# Patient Record
Sex: Male | Born: 1965 | Race: Black or African American | Hispanic: No | Marital: Married | Smoking: Current some day smoker
Health system: Southern US, Community
[De-identification: ages and names within clinical notes are randomized; demographics above are authoritative.]

## PROBLEM LIST (undated history)

## (undated) DIAGNOSIS — E785 Hyperlipidemia, unspecified: Secondary | ICD-10-CM

## (undated) HISTORY — DX: Hyperlipidemia, unspecified: E78.5

## (undated) HISTORY — PX: HAND SURGERY: SHX662

## (undated) HISTORY — PX: BACK SURGERY: SHX140

---

## 2002-10-02 ENCOUNTER — Emergency Department (HOSPITAL_COMMUNITY): Admission: EM | Admit: 2002-10-02 | Discharge: 2002-10-02 | Payer: Self-pay | Admitting: Emergency Medicine

## 2003-07-30 ENCOUNTER — Emergency Department (HOSPITAL_COMMUNITY): Admission: EM | Admit: 2003-07-30 | Discharge: 2003-07-30 | Payer: Self-pay | Admitting: Emergency Medicine

## 2004-08-03 ENCOUNTER — Emergency Department (HOSPITAL_COMMUNITY): Admission: EM | Admit: 2004-08-03 | Discharge: 2004-08-03 | Payer: Self-pay | Admitting: Emergency Medicine

## 2004-08-06 ENCOUNTER — Ambulatory Visit (HOSPITAL_BASED_OUTPATIENT_CLINIC_OR_DEPARTMENT_OTHER): Admission: RE | Admit: 2004-08-06 | Discharge: 2004-08-06 | Payer: Self-pay | Admitting: Orthopedic Surgery

## 2010-03-29 ENCOUNTER — Inpatient Hospital Stay (HOSPITAL_COMMUNITY): Admission: EM | Admit: 2010-03-29 | Discharge: 2010-04-01 | Payer: Self-pay | Admitting: Emergency Medicine

## 2010-05-11 ENCOUNTER — Ambulatory Visit: Payer: Self-pay | Admitting: Psychiatry

## 2010-08-28 LAB — COMPREHENSIVE METABOLIC PANEL
ALT: 37 U/L (ref 0–53)
AST: 29 U/L (ref 0–37)
Albumin: 3.2 g/dL — ABNORMAL LOW (ref 3.5–5.2)
Alkaline Phosphatase: 53 U/L (ref 39–117)
BUN: 8 mg/dL (ref 6–23)
CO2: 26 mEq/L (ref 19–32)
Calcium: 8.8 mg/dL (ref 8.4–10.5)
Chloride: 112 mEq/L (ref 96–112)
Creatinine, Ser: 1.31 mg/dL (ref 0.4–1.5)
GFR calc Af Amer: >60 mL/min (ref >60)
GFR calc non Af Amer: 60 mL/min — ABNORMAL LOW (ref >60)
Glucose, Bld: 105 mg/dL — ABNORMAL HIGH (ref 70–99)
Potassium: 4.1 mEq/L (ref 3.5–5.1)
Sodium: 142 mEq/L (ref 135–145)
Total Bilirubin: 1.4 mg/dL — ABNORMAL HIGH (ref 0.3–1.2)
Total Protein: 5.4 g/dL — ABNORMAL LOW (ref 6.0–8.3)

## 2010-08-28 LAB — MAGNESIUM: Magnesium: 2.2 mg/dL (ref 1.5–2.5)

## 2010-08-28 LAB — CBC
HCT: 41 % (ref 39.0–52.0)
Hemoglobin: 13.7 g/dL (ref 13.0–17.0)
MCH: 27.5 pg (ref 26.0–34.0)
MCHC: 33.4 g/dL (ref 30.0–36.0)
MCV: 82.3 fL (ref 78.0–100.0)
Platelets: 196 10*3/uL (ref 150–400)
RBC: 4.98 MIL/uL (ref 4.22–5.81)
RDW: 13.5 % (ref 11.5–15.5)
WBC: 5.8 10*3/uL (ref 4.0–10.5)

## 2010-08-28 LAB — PHOSPHORUS: Phosphorus: 3.7 mg/dL (ref 2.3–4.6)

## 2010-08-28 LAB — AMMONIA: Ammonia: 56 umol/L — ABNORMAL HIGH (ref 11–35)

## 2010-08-28 LAB — CK: Total CK: 672 U/L — ABNORMAL HIGH (ref 7–232)

## 2010-08-29 LAB — CBC
HCT: 42 % (ref 39.0–52.0)
HCT: 44.8 % (ref 39.0–52.0)
Hemoglobin: 14.5 g/dL (ref 13.0–17.0)
Hemoglobin: 15.2 g/dL (ref 13.0–17.0)
MCH: 27.7 pg (ref 26.0–34.0)
MCH: 28.1 pg (ref 26.0–34.0)
MCHC: 33.9 g/dL (ref 30.0–36.0)
MCHC: 34.5 g/dL (ref 30.0–36.0)
MCV: 81.4 fL (ref 78.0–100.0)
MCV: 81.8 fL (ref 78.0–100.0)
Platelets: 194 10*3/uL (ref 150–400)
Platelets: 202 10*3/uL (ref 150–400)
RBC: 5.16 MIL/uL (ref 4.22–5.81)
RBC: 5.48 MIL/uL (ref 4.22–5.81)
RDW: 13.2 % (ref 11.5–15.5)
RDW: 13.2 % (ref 11.5–15.5)
WBC: 4.5 10*3/uL (ref 4.0–10.5)
WBC: 5.6 10*3/uL (ref 4.0–10.5)

## 2010-08-29 LAB — COMPREHENSIVE METABOLIC PANEL
ALT: 49 U/L (ref 0–53)
ALT: 51 U/L (ref 0–53)
AST: 46 U/L — ABNORMAL HIGH (ref 0–37)
AST: 48 U/L — ABNORMAL HIGH (ref 0–37)
Albumin: 3.8 g/dL (ref 3.5–5.2)
Albumin: 3.8 g/dL (ref 3.5–5.2)
Alkaline Phosphatase: 57 U/L (ref 39–117)
Alkaline Phosphatase: 58 U/L (ref 39–117)
BUN: 8 mg/dL (ref 6–23)
BUN: 8 mg/dL (ref 6–23)
CO2: 27 mEq/L (ref 19–32)
CO2: 28 mEq/L (ref 19–32)
Calcium: 9 mg/dL (ref 8.4–10.5)
Calcium: 9.2 mg/dL (ref 8.4–10.5)
Chloride: 105 mEq/L (ref 96–112)
Chloride: 107 mEq/L (ref 96–112)
Creatinine, Ser: 1.22 mg/dL (ref 0.4–1.5)
Creatinine, Ser: 1.23 mg/dL (ref 0.4–1.5)
GFR calc Af Amer: >60 mL/min (ref >60)
GFR calc Af Amer: >60 mL/min (ref >60)
GFR calc non Af Amer: >60 mL/min (ref >60)
GFR calc non Af Amer: >60 mL/min (ref >60)
Glucose, Bld: 101 mg/dL — ABNORMAL HIGH (ref 70–99)
Glucose, Bld: 111 mg/dL — ABNORMAL HIGH (ref 70–99)
Potassium: 3.1 mEq/L — ABNORMAL LOW (ref 3.5–5.1)
Potassium: 3.9 mEq/L (ref 3.5–5.1)
Sodium: 138 mEq/L (ref 135–145)
Sodium: 141 mEq/L (ref 135–145)
Total Bilirubin: 1 mg/dL (ref 0.3–1.2)
Total Bilirubin: 1.1 mg/dL (ref 0.3–1.2)
Total Protein: 6 g/dL (ref 6.0–8.3)
Total Protein: 6.2 g/dL (ref 6.0–8.3)

## 2010-08-29 LAB — URINALYSIS, ROUTINE W REFLEX MICROSCOPIC
Bilirubin Urine: NEGATIVE
Glucose, UA: NEGATIVE mg/dL
Hgb urine dipstick: NEGATIVE
Ketones, ur: NEGATIVE mg/dL
Nitrite: NEGATIVE
Protein, ur: NEGATIVE mg/dL
Specific Gravity, Urine: 1.019 (ref 1.005–1.030)
Urobilinogen, UA: 1 mg/dL (ref 0.0–1.0)
pH: 6 (ref 5.0–8.0)

## 2010-08-29 LAB — DIFFERENTIAL
Basophils Absolute: 0 10*3/uL (ref 0.0–0.1)
Basophils Absolute: 0 10*3/uL (ref 0.0–0.1)
Basophils Relative: 0 % (ref 0–1)
Basophils Relative: 0 % (ref 0–1)
Eosinophils Absolute: 0.1 10*3/uL (ref 0.0–0.7)
Eosinophils Absolute: 0.1 10*3/uL (ref 0.0–0.7)
Eosinophils Relative: 1 % (ref 0–5)
Eosinophils Relative: 1 % (ref 0–5)
Lymphocytes Relative: 37 % (ref 12–46)
Lymphocytes Relative: 40 % (ref 12–46)
Lymphs Abs: 1.7 10*3/uL (ref 0.7–4.0)
Lymphs Abs: 2.3 10*3/uL (ref 0.7–4.0)
Monocytes Absolute: 0.3 10*3/uL (ref 0.1–1.0)
Monocytes Absolute: 0.3 10*3/uL (ref 0.1–1.0)
Monocytes Relative: 6 % (ref 3–12)
Monocytes Relative: 6 % (ref 3–12)
Neutro Abs: 2.5 10*3/uL (ref 1.7–7.7)
Neutro Abs: 2.9 10*3/uL (ref 1.7–7.7)
Neutrophils Relative %: 52 % (ref 43–77)
Neutrophils Relative %: 55 % (ref 43–77)

## 2010-08-29 LAB — SALICYLATE LEVEL: Salicylate Lvl: <4 mg/dL (ref 2.8–20.0)

## 2010-08-29 LAB — ACETAMINOPHEN LEVEL: Acetaminophen (Tylenol), Serum: <10 ug/mL — ABNORMAL LOW (ref 10–30)

## 2010-08-29 LAB — RAPID URINE DRUG SCREEN, HOSP PERFORMED
Amphetamines: NOT DETECTED
Amphetamines: NOT DETECTED
Barbiturates: NOT DETECTED
Benzodiazepines: POSITIVE — AB
Benzodiazepines: POSITIVE — AB
Cocaine: NOT DETECTED
Cocaine: NOT DETECTED
Opiates: NOT DETECTED
Opiates: NOT DETECTED
Tetrahydrocannabinol: NOT DETECTED
Tetrahydrocannabinol: NOT DETECTED

## 2010-08-29 LAB — ETHANOL: Alcohol, Ethyl (B): <5 mg/dL (ref 0–10)

## 2010-08-29 LAB — MRSA PCR SCREENING: MRSA by PCR: NEGATIVE

## 2010-08-29 LAB — MAGNESIUM: Magnesium: 2.2 mg/dL (ref 1.5–2.5)

## 2010-08-29 LAB — CK TOTAL AND CKMB (NOT AT ARMC)
CK, MB: 14.4 ng/mL (ref 0.3–4.0)
Relative Index: 0.9 (ref 0.0–2.5)
Total CK: 1617 U/L — ABNORMAL HIGH (ref 7–232)

## 2010-08-29 LAB — CARDIAC PANEL(CRET KIN+CKTOT+MB+TROPI)
CK, MB: 8 ng/mL (ref 0.3–4.0)
Relative Index: 0.8 (ref 0.0–2.5)
Total CK: 986 U/L — ABNORMAL HIGH (ref 7–232)
Troponin I: 0.02 ng/mL (ref 0.00–0.06)

## 2010-08-29 LAB — AMMONIA: Ammonia: 40 umol/L — ABNORMAL HIGH (ref 11–35)

## 2010-11-01 NOTE — Op Note (Signed)
NAMEQUANTAVIS, OBRYANT NO.:  [id number]   MEDICAL RECORD NO.:  [id number]          PATIENT TYPE:  AMB   LOCATION:  DSC                          FACILITY:  [facility]   PHYSICIAN:  Dionne Ano. Gramig III, M.D.DATE OF BIRTH:  Jan 26, 1966   DATE OF PROCEDURE:  08/06/2004  DATE OF DISCHARGE:                                 OPERATIVE REPORT   PREOPERATIVE DIAGNOSES:  1.  Right hand fifth metacarpal fracture with gross displacement.  2.  Open type 1 laceration over the dorsal aspect of the hand.   POSTOPERATIVE DIAGNOSIS:  1.  Right hand fifth metacarpal fracture with gross displacement.  2.  Open type 1 laceration over the dorsal aspect of the hand.   PROCEDURES:  1.  Irrigation and debridement of type 1 open fifth metacarpal fracture of      the hand.  2.  Open reduction and internal fixation of right hand fifth metacarpal      fracture.  3.  Stress radiography of right hand.   SURGEON:  Dionne Ano. Amanda Pea, M.D.   ASSISTANT:  Karie Chimera, P.A.-C.   COMPLICATIONS:  None.   ANESTHESIA:  General.   COMPLICATIONS:  None.   TOURNIQUET TIME:  Less than an hour.   ESTIMATED BLOOD LOSS:  Minimal.   INDICATIONS FOR THE PROCEDURE:  This patient is a pleasant 45 year old male  who presents with the above-mentioned diagnoses.  I have counseled him in  regards to the risks and benefits of surgery, including the risks of  infection, bleeding, anesthesia, damage to surrounding structures and  failure of surgery to accomplish the intended goals is relieving symptoms  and restoring function.  Fortunately there are no signs of infection,  however, I feel this is going to require an I&D, as well as the ORIF.  He  understands this.  He presents to my office yesterday.  I booked him for  surgery as soon as possible, of course.  He understands the risks and  benefits of injection, bleeding, anesthesia, damage to normal structures and  failure of surgery to accomplish the  intended goals is of relieving symptoms  and restoring function.   OPERATION IN DETAIL:  The patient was seen by myself and anesthesia, taken  to the operative suite and underwent a smooth induction of general  anesthesia.  He was laid supine, fully padded, prepped and draped in the  usual sterile fashion with Betadine scrub and paint.  A sterile field was  secured.  Once this was done, I identified the small puncture wound over the  dorsal aspect of his hand.  Preoperatively, the patient stated that  __________  about a 2 mm rim.  Following this, I dissected down to the  fracture site, delivered both bony ends, curetted and debrided the area and  saw no signs of infection.  I then performed I&D with greater than 3 L of  saline about the fracture site.  Following this, I secured a new sterile  field and the I&D was complete.  The patient then had a small incision made  about the base of  the fifth metacarpal.  A pilot hole was made with drill.  Following this, a blunt ended 0.062 K-wire was introduced and threaded up  the medullary shaft, across the fracture site, which was held reduced, and  then engaging the distal bone.  Once this was done, I then prebent the wire  __________  fracture and secured the pin away from tendinous structures and  the dorsal sensory branch of the ulnar nerve.  This crossed the fracture  site nicely.  It had excellent stability.  I checked this under stress  radiography, performed a stress x-ray and all looked quite well.  I was  pleased with this and the findings.  Once this was done, I then performed  bipolar electrocautery.  The wound was closed loosely distally and  proximally was closed with Prolene to my satisfaction.  Xeroform gauze and a  volar splint were applied without difficulty.  He tolerated the procedure  well.  The sponge, needle and instrument counts were reported as correct.  He will be monitored in the recovery room, given additional  antibiotics,  discharged home on appropriate medicine and antibiotics and return to see [place]  in the office in five to seven days.  All questions have been encouraged and  answered.      WMG/MEDQ  D:  08/06/2004  T:  08/06/2004  Job:  [id number]

## 2011-06-19 ENCOUNTER — Emergency Department (HOSPITAL_BASED_OUTPATIENT_CLINIC_OR_DEPARTMENT_OTHER)
Admission: EM | Admit: 2011-06-19 | Discharge: 2011-06-19 | Disposition: A | Discharge status: Home / Self Care | Payer: Self-pay | Attending: Emergency Medicine | Admitting: Emergency Medicine

## 2011-06-19 ENCOUNTER — Emergency Department (INDEPENDENT_AMBULATORY_CARE_PROVIDER_SITE_OTHER): Payer: Self-pay

## 2011-06-19 ENCOUNTER — Encounter: Payer: Self-pay | Admitting: *Deleted

## 2011-06-19 DIAGNOSIS — R0989 Other specified symptoms and signs involving the circulatory and respiratory systems: Secondary | ICD-10-CM

## 2011-06-19 DIAGNOSIS — J9819 Other pulmonary collapse: Secondary | ICD-10-CM

## 2011-06-19 DIAGNOSIS — R05 Cough: Secondary | ICD-10-CM | POA: Insufficient documentation

## 2011-06-19 DIAGNOSIS — R059 Cough, unspecified: Secondary | ICD-10-CM | POA: Insufficient documentation

## 2011-06-19 MED ORDER — BENZONATATE 100 MG PO CAPS: 100.0000 mg | ORAL_CAPSULE | Freq: Three times a day (TID) | ORAL | Status: AC | PRN

## 2011-06-19 MED ORDER — ALBUTEROL SULFATE HFA 108 (90 BASE) MCG/ACT IN AERS
2.0000 | INHALATION_SPRAY | RESPIRATORY_TRACT | Status: DC | PRN
  Administered 2011-06-19: 2 via RESPIRATORY_TRACT
  Filled 2011-06-19: qty 6.7

## 2011-06-19 NOTE — ED Provider Notes (Signed)
History     CSN: [id number]  Arrival date & time 06/19/11  6295   First MD Initiated Contact with Patient 06/19/11 1946      Chief Complaint  Patient presents with  . Cough    (Consider location/radiation/quality/duration/timing/severity/associated sxs/prior treatment) HPI  45yoM is a healthy presents with cough and nasal congestion x3 days. He states he is coughing worse at night. Denies shortness of breath. He states his phlegm is yellow with streaks of blood. He has noticed less than 1 teaspoon. He denies fever, chills. He denies chest pain except for when coughing. No sick contacts at home. No history of similar. He denies anticoagulants. Denies h/o VTE in self or family. No recent hosp/surg/immob. No h/o cancer. Denies exogenous hormone use, no leg pain or swelling    ED Notes, ED Provider Notes from 06/19/11 0000 to 06/19/11 19:06:57       Amy Theotis Barrio, RN 06/19/2011 19:06      Pt amb to triage with quick steady gait reporting cough and congestion x Monday. Pt states his sputum is sometimes bloody, and mostly yellow and thick.     History reviewed. No pertinent past medical history.  History reviewed. No pertinent past surgical history.  History reviewed. No pertinent family history.  History  Substance Use Topics  . Smoking status: Not on file  . Smokeless tobacco: Not on file  . Alcohol Use: Not on file      Review of Systems  All other systems reviewed and are negative.   except as noted HPI   Allergies  Review of patient's allergies indicates no known allergies.  Home Medications   Current Outpatient Rx  Name Route Sig Dispense Refill  . BENZONATATE 100 MG PO CAPS Oral Take 1 capsule (100 mg total) by mouth 3 (three) times daily as needed for cough. 20 capsule 0    BP 143/74  Pulse 80  Temp(Src) 98.9 F (37.2 C) (Oral)  Resp 20  Ht 6\' 3"  (1.905 m)  Wt 229 lb 11.2 oz (104.191 kg)  BMI 28.71 kg/m2  SpO2 99%  Physical Exam  Nursing note and  vitals reviewed. Constitutional: He is oriented to person, place, and time. He appears well-developed and well-nourished. No distress.  HENT:  Head: Atraumatic.  Mouth/Throat: Oropharynx is clear and moist.       Tm clear b/l Posterior op with mild erythema  Eyes: Conjunctivae are normal. Pupils are equal, round, and reactive to light.  Neck: Neck supple.  Cardiovascular: Normal rate, regular rhythm, normal heart sounds and intact distal pulses.  Exam reveals no gallop and no friction rub.   No murmur heard. Pulmonary/Chest: Effort normal. No respiratory distress. He has no wheezes. He has no rales.  Abdominal: Soft. Bowel sounds are normal. There is no tenderness. There is no rebound and no guarding.  Musculoskeletal: Normal range of motion. He exhibits no edema and no tenderness.  Neurological: He is alert and oriented to person, place, and time.  Skin: Skin is warm and dry.  Psychiatric: He has a normal mood and affect.    ED Course  Procedures (including critical care time)  Labs Reviewed - No data to display Dg Chest 2 View  06/19/2011  *RADIOLOGY REPORT*  Clinical Data: Congestion.  CHEST - 2 VIEW  Comparison: 03/31/2010  Findings: Mild bibasilar atelectasis.  No focal infiltrate, edema or pleural effusion.  Heart size is normal.  The bony thorax is unremarkable.  IMPRESSION: Mild bibasilar atelectasis.  Original Report  Authenticated By: Reola Calkins, M.D.     1. Cough     MDM  Cough likely secondary to upper respiratory infection. I believe his blood streaked sputum to be related to irritation of his upper airway. Prescribed albuterol inhaler when necessary for cough as well as Tessalon Perles. The patient is to follow with his primary care Dr. He has been given strict precautions for return including gross hemoptysis, chest pain, shortness of breath.        Forbes Cellar, MD 06/19/11 2107

## 2011-06-19 NOTE — ED Notes (Signed)
Pt amb to triage with quick steady gait reporting cough and congestion x Monday. Pt states his sputum is sometimes bloody, and mostly yellow and thick.

## 2011-06-27 ENCOUNTER — Emergency Department (HOSPITAL_BASED_OUTPATIENT_CLINIC_OR_DEPARTMENT_OTHER)
Admission: EM | Admit: 2011-06-27 | Discharge: 2011-06-27 | Disposition: A | Discharge status: Home / Self Care | Payer: Self-pay | Attending: Emergency Medicine | Admitting: Emergency Medicine

## 2011-06-27 ENCOUNTER — Encounter (HOSPITAL_BASED_OUTPATIENT_CLINIC_OR_DEPARTMENT_OTHER): Payer: Self-pay

## 2011-06-27 ENCOUNTER — Emergency Department (INDEPENDENT_AMBULATORY_CARE_PROVIDER_SITE_OTHER): Payer: Self-pay

## 2011-06-27 DIAGNOSIS — R059 Cough, unspecified: Secondary | ICD-10-CM | POA: Insufficient documentation

## 2011-06-27 DIAGNOSIS — J069 Acute upper respiratory infection, unspecified: Secondary | ICD-10-CM | POA: Insufficient documentation

## 2011-06-27 DIAGNOSIS — R0989 Other specified symptoms and signs involving the circulatory and respiratory systems: Secondary | ICD-10-CM

## 2011-06-27 DIAGNOSIS — R079 Chest pain, unspecified: Secondary | ICD-10-CM | POA: Insufficient documentation

## 2011-06-27 DIAGNOSIS — R05 Cough: Secondary | ICD-10-CM

## 2011-06-27 LAB — CBC
MCHC: 34.6 g/dL (ref 30.0–36.0)
RDW: 13.4 % (ref 11.5–15.5)

## 2011-06-27 MED ORDER — ALBUTEROL SULFATE HFA 108 (90 BASE) MCG/ACT IN AERS: 2.0000 | INHALATION_SPRAY | RESPIRATORY_TRACT | Status: DC | PRN

## 2011-06-27 MED ORDER — HYDROCODONE-ACETAMINOPHEN 7.5-325 MG/15ML PO SOLN: 15.0000 mL | Freq: Four times a day (QID) | ORAL | Status: AC | PRN

## 2011-06-27 NOTE — ED Notes (Signed)
C/o cont'd prod cough-was seen here for same last week

## 2011-06-27 NOTE — ED Notes (Signed)
Informed by xray tech that pt stating he needs to leave by 1230p to pick up child

## 2011-06-27 NOTE — ED Provider Notes (Addendum)
History     CSN: [id number]  Arrival date & time 06/27/11  1047   First MD Initiated Contact with Patient 06/27/11 1111      Chief Complaint  Patient presents with  . Cough    (Consider location/radiation/quality/duration/timing/severity/associated sxs/prior treatment) Patient is a 46 y.o. male presenting with cough. The history is provided by the patient.  Cough Associated symptoms include chest pain. Pertinent negatives include no headaches and no shortness of breath.   patient was seen in ER on New Year's Eve for cough. He hasn't some amounts that time. He was given an inhaler and Tessalon Perles at bedtime. He states he has not gotten better. He still continues to cough. No fevers. He states he still has yellow sputum. His hemoptysis is cleared up. No swelling of his legs. Mild chest pain with it. Some fatigue when it flares up. No nausea vomiting diarrhea. No sore throat. No relief with his medications.   History reviewed. No pertinent past medical history.  Past Surgical History  Procedure Date  . Hand surgery   . Back surgery     No family history on file.  History  Substance Use Topics  . Smoking status: Never Smoker   . Smokeless tobacco: Not on file  . Alcohol Use: No      Review of Systems  Constitutional: Positive for fatigue. Negative for activity change and appetite change.  HENT: Negative for neck stiffness.   Eyes: Negative for pain.  Respiratory: Positive for cough. Negative for chest tightness and shortness of breath.   Cardiovascular: Positive for chest pain. Negative for leg swelling.  Gastrointestinal: Negative for nausea, vomiting, abdominal pain and diarrhea.  Genitourinary: Negative for flank pain.  Musculoskeletal: Negative for back pain.  Skin: Negative for rash.  Neurological: Negative for weakness, numbness and headaches.  Psychiatric/Behavioral: Negative for behavioral problems.    Allergies  Review of patient's allergies indicates no  known allergies.  Home Medications   Current Outpatient Rx  Name Route Sig Dispense Refill  . VENTOLIN IN Inhalation Inhale into the lungs.    . ALBUTEROL SULFATE HFA 108 (90 BASE) MCG/ACT IN AERS Inhalation Inhale 2 puffs into the lungs every 4 (four) hours as needed for wheezing. 6.7 g 0  . BENZONATATE 100 MG PO CAPS Oral Take 1 capsule (100 mg total) by mouth 3 (three) times daily as needed for cough. 20 capsule 0  . HYDROCODONE-ACETAMINOPHEN 7.5-325 MG/15ML PO SOLN Oral Take 15 mLs by mouth 4 (four) times daily as needed (cough). 120 mL 0    BP 129/67  Pulse 77  Temp(Src) 97.9 F (36.6 C) (Oral)  Resp 19  Ht 6\' 3"  (1.905 m)  Wt 218 lb (98.884 kg)  BMI 27.25 kg/m2  SpO2 98%  Physical Exam  Nursing note and vitals reviewed. Constitutional: He is oriented to person, place, and time. He appears well-developed and well-nourished.  HENT:  Head: Normocephalic and atraumatic.  Eyes: Pupils are equal, round, and reactive to light.  Neck: Normal range of motion. Neck supple.  Cardiovascular: Normal rate, regular rhythm and normal heart sounds.   No murmur heard. Pulmonary/Chest: Effort normal.       Mild soft wheezes right upper lung fields.  Abdominal: Soft. Bowel sounds are normal. He exhibits no distension and no mass. There is no tenderness. There is no rebound and no guarding.  Musculoskeletal: Normal range of motion. He exhibits no edema.  Neurological: He is alert and oriented to person, place, and time. No  cranial nerve deficit.  Skin: Skin is warm and dry.  Psychiatric: He has a normal mood and affect.    ED Course  Procedures (including critical care time)  Labs Reviewed  CBC - Abnormal; Notable for the following:    MCV 77.8 (*)    All other components within normal limits   Dg Chest 2 View  06/27/2011  *RADIOLOGY REPORT*  Clinical Data: Productive cough, congestion.  CHEST - 2 VIEW  Comparison: 06/19/2011  Findings: Heart and mediastinal contours are within  normal limits. No focal opacities or effusions.  No acute bony abnormality.  IMPRESSION: No active cardiopulmonary disease.  Original Report Authenticated By: Cyndie Chime, M.D.     1. URI (upper respiratory infection)       MDM  Cough. Recently seen for same. X-ray was repeated to show pneumonia now. He'll be discharged home. Platelets were done due to previous hemoptysis. They're not low.        Juliet Rude. Rubin Payor, MD 06/27/11 1516  Juliet Rude. Rubin Payor, MD 06/27/11 [phone]

## 2014-05-05 ENCOUNTER — Encounter (HOSPITAL_BASED_OUTPATIENT_CLINIC_OR_DEPARTMENT_OTHER): Payer: Self-pay | Admitting: *Deleted

## 2014-05-05 ENCOUNTER — Emergency Department (HOSPITAL_BASED_OUTPATIENT_CLINIC_OR_DEPARTMENT_OTHER)
Admission: EM | Admit: 2014-05-05 | Discharge: 2014-05-05 | Disposition: A | Discharge status: Home / Self Care | Payer: Self-pay | Attending: Emergency Medicine | Admitting: Emergency Medicine

## 2014-05-05 DIAGNOSIS — Z79899 Other long term (current) drug therapy: Secondary | ICD-10-CM | POA: Insufficient documentation

## 2014-05-05 DIAGNOSIS — J111 Influenza due to unidentified influenza virus with other respiratory manifestations: Secondary | ICD-10-CM

## 2014-05-05 NOTE — ED Provider Notes (Signed)
CSN: [id number]     Arrival date & time 05/05/14  0901 History   First MD Initiated Contact with Patient 05/05/14 [phone]     Chief Complaint  Patient presents with  . Influenza     (Consider location/radiation/quality/duration/timing/severity/associated sxs/prior Treatment) HPI Comments: Patient presents with flulike symptoms. He states that on Monday which was 5 days ago he started having symptoms of myalgias associated with runny nose congestion coughing. He denies any nausea or vomiting. He denies any shortness of breath. He has not been using medications or the symptoms. He complains of fatigue and subjective fevers. His wife was diagnosed with influenza via a rapid flu test a week ago. She was started on Tamiflu.  Patient is a 48 y.o. male presenting with flu symptoms.  Influenza Presenting symptoms: cough, fatigue, fever, myalgias and rhinorrhea   Presenting symptoms: no diarrhea, no headaches, no nausea, no shortness of breath and no vomiting   Associated symptoms: nasal congestion   Associated symptoms: no chills     History reviewed. No pertinent past medical history. Past Surgical History  Procedure Laterality Date  . Hand surgery    . Back surgery    . Back surgery     No family history on file. History  Substance Use Topics  . Smoking status: Never Smoker   . Smokeless tobacco: Not on file  . Alcohol Use: No    Review of Systems  Constitutional: Positive for fever and fatigue. Negative for chills and diaphoresis.  HENT: Positive for congestion and rhinorrhea. Negative for sneezing.   Eyes: Negative.   Respiratory: Positive for cough. Negative for chest tightness and shortness of breath.   Cardiovascular: Negative for chest pain and leg swelling.  Gastrointestinal: Negative for nausea, vomiting, abdominal pain, diarrhea and blood in stool.  Genitourinary: Negative for frequency, hematuria, flank pain and difficulty urinating.  Musculoskeletal: Positive for  myalgias. Negative for back pain and arthralgias.  Skin: Negative for rash.  Neurological: Negative for dizziness, speech difficulty, weakness, numbness and headaches.      Allergies  Review of patient's allergies indicates no known allergies.  Home Medications   Prior to Admission medications   Medication Sig Start Date End Date Taking? Authorizing Provider  albuterol (PROVENTIL HFA;VENTOLIN HFA) 108 (90 BASE) MCG/ACT inhaler Inhale 2 puffs into the lungs every 4 (four) hours as needed for wheezing. 06/27/11 06/26/12  Juliet RudeNathan R. Pickering, MD  Albuterol (VENTOLIN IN) Inhale into the lungs.    Historical Provider, MD   BP 158/76 mmHg  Pulse 74  Temp(Src) 98.1 F (36.7 C) (Oral)  Resp 18  Ht 6\' 3"  (1.905 m)  Wt 218 lb (98.884 kg)  BMI 27.25 kg/m2  SpO2 100% Physical Exam  Constitutional: He is oriented to person, place, and time. He appears well-developed and well-nourished.  HENT:  Head: Normocephalic and atraumatic.  Eyes: Pupils are equal, round, and reactive to light.  Neck: Normal range of motion. Neck supple.  Cardiovascular: Normal rate, regular rhythm and normal heart sounds.   Pulmonary/Chest: Effort normal and breath sounds normal. No respiratory distress. He has no wheezes. He has no rales. He exhibits no tenderness.  Abdominal: Soft. Bowel sounds are normal. There is no tenderness. There is no rebound and no guarding.  Musculoskeletal: Normal range of motion. He exhibits no edema.  Lymphadenopathy:    He has no cervical adenopathy.  Neurological: He is alert and oriented to person, place, and time.  Skin: Skin is warm and dry. No rash noted.  Psychiatric: He has a normal mood and affect.    ED Course  Procedures (including critical care time) Labs Review Labs Reviewed - No data to display  Imaging Review No results found.   EKG Interpretation None      MDM   Final diagnoses:  Influenza    Patient with flulike symptoms with a positive recent  exposure to influenza. He is well-appearing with no shortness of breath or evidence of pneumonia on clinical exam. He's had no vomiting. He was discharged home in good condition. He was advised an over-the-counter symptomatic care. He's out of the window for appropriate treatment with Tamiflu. He was given return precautions.    Rolan BuccoMelanie Carlisle Enke, MD 05/05/14 [phone]

## 2014-05-05 NOTE — ED Notes (Signed)
Pt c/o fever, nausea, and body aches x 2 days. Pt's wife had flu-like symptoms last week. Pt did not get a flu shot.

## 2014-05-05 NOTE — Discharge Instructions (Signed)

## 2015-08-27 ENCOUNTER — Emergency Department (HOSPITAL_BASED_OUTPATIENT_CLINIC_OR_DEPARTMENT_OTHER)
Admission: EM | Admit: 2015-08-27 | Discharge: 2015-08-28 | Disposition: A | Discharge status: Home / Self Care | Payer: BLUE CROSS/BLUE SHIELD | Attending: Emergency Medicine | Admitting: Emergency Medicine

## 2015-08-27 ENCOUNTER — Encounter (HOSPITAL_BASED_OUTPATIENT_CLINIC_OR_DEPARTMENT_OTHER): Payer: Self-pay | Admitting: *Deleted

## 2015-08-27 ENCOUNTER — Emergency Department (HOSPITAL_BASED_OUTPATIENT_CLINIC_OR_DEPARTMENT_OTHER): Payer: BLUE CROSS/BLUE SHIELD

## 2015-08-27 DIAGNOSIS — R509 Fever, unspecified: Secondary | ICD-10-CM | POA: Diagnosis present

## 2015-08-27 DIAGNOSIS — J111 Influenza due to unidentified influenza virus with other respiratory manifestations: Secondary | ICD-10-CM | POA: Diagnosis not present

## 2015-08-27 DIAGNOSIS — R69 Illness, unspecified: Secondary | ICD-10-CM

## 2015-08-27 NOTE — ED Notes (Signed)
Generalized body aches, cough and subjective fever for a week.

## 2015-08-27 NOTE — ED Notes (Signed)
Generalized body aches and fever x 5 days.

## 2015-08-27 NOTE — ED Notes (Signed)
Pt reports productive cough with yellow sputum x 7 days.

## 2015-08-28 MED ORDER — PREDNISONE 50 MG PO TABS: 50.0000 mg | ORAL_TABLET | Freq: Every day | ORAL | Status: DC

## 2015-08-28 MED ORDER — PROMETHAZINE-DM 6.25-15 MG/5ML PO SYRP: 5.0000 mL | ORAL_SOLUTION | Freq: Four times a day (QID) | ORAL | Status: DC | PRN

## 2015-08-28 MED ORDER — GUAIFENESIN ER 1200 MG PO TB12: 1.0000 | ORAL_TABLET | Freq: Two times a day (BID) | ORAL | Status: DC

## 2015-08-28 NOTE — ED Notes (Signed)
Pt verbalizes understanding of d/c instructions and denies any further needs at this time. 

## 2015-08-28 NOTE — Discharge Instructions (Signed)
Return here as needed. Follow up with your doctor. INcrease your fluid intake and rest as much as possible.

## 2015-08-28 NOTE — ED Provider Notes (Signed)
CSN: [id number]     Arrival date & time 08/27/15  2004 History   First MD Initiated Contact with Patient 08/27/15 2303     Chief Complaint  Patient presents with  . Fever     (Consider location/radiation/quality/duration/timing/severity/associated sxs/prior Treatment) HPI Patient presents to the emergency department with cough, body aches, fever over the last 7 days.  The patient states that he took some over-the-counter cough and cold medications without significant relief of his symptoms.  Patient denies chest pain, shortness of breath, nausea, vomiting, headache, blurred vision, weakness, dizziness, back pain, neck pain, urinary incontinence, rash, near syncope or syncope.  The patient states that nothing seems make his condition, better or worse History reviewed. No pertinent past medical history. Past Surgical History  Procedure Laterality Date  . Hand surgery    . Back surgery    . Back surgery     History reviewed. No pertinent family history. Social History  Substance Use Topics  . Smoking status: Never Smoker   . Smokeless tobacco: None  . Alcohol Use: No    Review of Systems  All other systems negative except as documented in the HPI. All pertinent positives and negatives as reviewed in the HPI.   Allergies  Review of patient's allergies indicates no known allergies.  Home Medications   Prior to Admission medications   Not on File   BP 133/70 mmHg  Pulse 60  Temp(Src) 98.4 F (36.9 C) (Oral)  Resp 16  Ht 6\' 3"  (1.905 m)  Wt 98.884 kg  BMI 27.25 kg/m2  SpO2 100% Physical Exam  Constitutional: He is oriented to person, place, and time. He appears well-developed and well-nourished. No distress.  HENT:  Head: Normocephalic and atraumatic.  Mouth/Throat: Oropharynx is clear and moist.  Eyes: Pupils are equal, round, and reactive to light.  Neck: Normal range of motion. Neck supple.  Cardiovascular: Normal rate, regular rhythm and normal heart sounds.  Exam  reveals no gallop and no friction rub.   No murmur heard. Pulmonary/Chest: Effort normal and breath sounds normal. No respiratory distress. He has no wheezes.  Neurological: He is alert and oriented to person, place, and time. He exhibits normal muscle tone. Coordination normal.  Skin: Skin is warm and dry. No rash noted. No erythema.  Psychiatric: He has a normal mood and affect. His behavior is normal.  Nursing note and vitals reviewed.   ED Course  Procedures (including critical care time) Labs Review Labs Reviewed - No data to display  Imaging Review Dg Chest 2 View  08/27/2015  CLINICAL DATA:  Cough, fever and body aches 1 week. EXAM: CHEST  2 VIEW COMPARISON:  06/27/2011 FINDINGS: Lungs are adequately inflated without focal consolidation or effusion. Mild stable cardiomegaly. Minimal degenerative change of the spine. IMPRESSION: No active cardiopulmonary disease. Mild stable cardiomegaly. Electronically Signed   By: Elberta Fortisaniel  Boyle M.D.   On: 08/27/2015 20:50   I have personally reviewed and evaluated these images and lab results as part of my medical decision-making.   The patient be treated for influenza-like illness.  Told to return here as needed.  Patient agrees the plan and all questions were answered.  Chest x-ray was reviewed and there is no significant abnormality noted  Charlestine Nighthristopher Wave Calzada, PA-C 08/28/15 [phone]  Rolan BuccoMelanie Belfi, MD 08/28/15 1745

## 2017-10-05 LAB — HM COLONOSCOPY

## 2018-01-06 DIAGNOSIS — E7800 Pure hypercholesterolemia, unspecified: Secondary | ICD-10-CM | POA: Insufficient documentation

## 2018-06-21 ENCOUNTER — Encounter: Payer: Self-pay | Admitting: Nurse Practitioner

## 2018-06-21 ENCOUNTER — Ambulatory Visit (INDEPENDENT_AMBULATORY_CARE_PROVIDER_SITE_OTHER): Payer: 59 | Admitting: Nurse Practitioner

## 2018-06-21 ENCOUNTER — Other Ambulatory Visit (HOSPITAL_COMMUNITY)
Admission: RE | Admit: 2018-06-21 | Discharge: 2018-06-21 | Disposition: A | Discharge status: Home / Self Care | Payer: 59 | Source: Ambulatory Visit | Attending: Nurse Practitioner | Admitting: Nurse Practitioner

## 2018-06-21 VITALS — BP 120/76 | HR 74 | Temp 98.4°F | Ht 72.84 in | Wt 236.4 lb

## 2018-06-21 DIAGNOSIS — E782 Mixed hyperlipidemia: Secondary | ICD-10-CM | POA: Diagnosis not present

## 2018-06-21 DIAGNOSIS — Z113 Encounter for screening for infections with a predominantly sexual mode of transmission: Secondary | ICD-10-CM

## 2018-06-21 DIAGNOSIS — H1013 Acute atopic conjunctivitis, bilateral: Secondary | ICD-10-CM | POA: Diagnosis not present

## 2018-06-21 DIAGNOSIS — R748 Abnormal levels of other serum enzymes: Secondary | ICD-10-CM

## 2018-06-21 DIAGNOSIS — K1379 Other lesions of oral mucosa: Secondary | ICD-10-CM

## 2018-06-21 DIAGNOSIS — J3089 Other allergic rhinitis: Secondary | ICD-10-CM

## 2018-06-21 MED ORDER — FLUTICASONE PROPIONATE 50 MCG/ACT NA SUSP: 2.0000 | Freq: Every day | NASAL | 0 refills | Status: DC

## 2018-06-21 MED ORDER — PROPYLENE GLYCOL 0.6 % OP SOLN: 1.0000 [drp] | Freq: Two times a day (BID) | OPHTHALMIC | Status: DC | PRN

## 2018-06-21 NOTE — Progress Notes (Signed)
Subjective:    Patient ID: Christian Lozano, male    DOB: 10/13/1965, 53 y.o.   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 The current episode started more than 1 month ago. The problem occurs constantly. The problem has been waxing and waning. The injury mechanism is unknown. There is no known exposure to pink eye. He does not wear contacts. Associated symptoms include eye redness, a foreign body sensation and itching. Pertinent negatives include no blurred vision, eye discharge, double vision, fever, nausea, photophobia, recent URI or vomiting. He has tried nothing for the symptoms.  Mouth Lesions   The current episode started more than 2 weeks ago. The onset was gradual. The problem has been unchanged. Nothing relieves the symptoms. Nothing aggravates the symptoms. Associated symptoms include eye itching, mouth sores and eye redness. Pertinent negatives include no fever, no double vision, no photophobia, no nausea, no vomiting, no neck pain, no neck stiffness, no cough, no rash and no eye discharge. There were no sick contacts.    Hyperlipidemia: Maintains low fat diet Not taking zetia or fenofibrate.  Sexual History (orientation,birth control, marital status, STD):married, sexually active, denies any rash or urinary symptoms. Wife was positive for HSV 2  Depression/Suicide: [facility] 06/21/2018  Decreased Interest 0  Down, Depressed, Hopeless 0  PHQ - 2 Score 0   No flowsheet data found.  Vision:up to date.  Dental: will schedule  Immunizations: (TDAP, Hep C screen, Pneumovax, Influenza, zoster)  Health Maintenance  Topic Date Due  . HIV Screening  02/15/1982  . Tetanus Vaccine  02/15/1986  . Colon Cancer Screening  02/15/2017  . Flu Shot  09/14/2018*  *Topic was postponed. The date shown is not the original due date.   Weight:  Wt Readings from Last 3 Encounters:  06/21/18 236 lb  6.4 oz (107.2 kg)  08/27/15 218 lb (98.9 kg)  05/05/14 218 lb (98.9 kg)   Exercise: none  Fall Risk: Fall Risk  06/21/2018  Falls in the past year? 0   Medications and allergies reviewed with patient and updated if appropriate.  There are no active problems to display for this patient.   Current Outpatient Medications on File Prior to Visit  Medication Sig Dispense Refill  . ezetimibe (ZETIA) 10 MG tablet Take by mouth.    . Guaifenesin 1200 MG TB12 Take 1 tablet (1,200 mg total) by mouth 2 (two) times daily. (Patient not taking: Reported on 06/21/2018) 20 each 0  . predniSONE (DELTASONE) 50 MG tablet Take 1 tablet (50 mg total) by mouth daily. (Patient not taking: Reported on 06/21/2018) 5 tablet 0  . promethazine-dextromethorphan (PROMETHAZINE-DM) 6.25-15 MG/5ML syrup Take 5 mLs by mouth 4 (four) times daily as needed for cough. (Patient not taking: Reported on 06/21/2018) 120 mL 0   No current facility-administered medications on file prior to visit.     Past Medical History:  Diagnosis Date  . Hyperlipidemia     Past Surgical History:  Procedure Laterality Date  . BACK SURGERY    . BACK SURGERY    . HAND SURGERY      Social History   Socioeconomic History  . Marital status: Married    Spouse name: Not on file  . Number of children: Not on file  . Years of education: Not on file  . Highest education level: Not on file  Occupational History  . Not  on file  Social Needs  . Financial resource strain: Not on file  . Food insecurity:    Worry: Not on file    Inability: Not on file  . Transportation needs:    Medical: Not on file    Non-medical: Not on file  Tobacco Use  . Smoking status: Current Some Day Smoker    Packs/day: 0.50    Types: Cigars  . Smokeless tobacco: Never Used  . Tobacco comment: once in blue moon  Substance and Sexual Activity  . Alcohol use: Yes    Alcohol/week: 2.0 standard drinks    Types: 1 Glasses of wine, 1 Cans of beer per week     Comment: socially  . Drug use: No  . Sexual activity: Yes    Birth control/protection: None  Lifestyle  . Physical activity:    Days per week: Not on file    Minutes per session: Not on file  . Stress: Not on file  Relationships  . Social connections:    Talks on phone: Not on file    Gets together: Not on file    Attends religious service: Not on file    Active member of club or organization: Not on file    Attends meetings of clubs or organizations: Not on file    Relationship status: Not on file  Other Topics Concern  . Not on file  Social History Narrative  . Not on file    Family History  Problem Relation Age of Onset  . HIV/AIDS Mother   . Diabetes Maternal Grandmother   . Diabetes Maternal Uncle         Review of Systems  Constitutional: Negative for fever.  HENT: Positive for mouth sores.   Eyes: Positive for redness and itching. Negative for blurred vision, double vision, photophobia and discharge.  Respiratory: Negative for cough.   Gastrointestinal: Negative for nausea and vomiting.  Musculoskeletal: Negative for neck pain.  Skin: Negative for rash.    Objective:   Vitals:   06/21/18 1339  BP: 120/76  Pulse: 74  Temp: 98.4 F (36.9 C)  SpO2: 93%    Body mass index is 31.33 kg/m.   Physical Examination:  Physical Exam HENT:     Mouth/Throat:     Mouth: Mucous membranes are moist.     Tongue: Lesions present.     Palate: No lesions.     Pharynx: No oropharyngeal exudate or posterior oropharyngeal erythema.     Comments: Nodule under tongue(nontender, no erythema) Neck:     Musculoskeletal: Normal range of motion and neck supple.  Cardiovascular:     Rate and Rhythm: Normal rate and regular rhythm.     Pulses: Normal pulses.     Heart sounds: Normal heart sounds.  Pulmonary:     Effort: Pulmonary effort is normal.     Breath sounds: Normal breath sounds.  Musculoskeletal:     Right lower leg: No edema.     Left lower leg: No edema.   Neurological:     Mental Status: He is alert.    ASSESSMENT and PLAN:  Bonifacio was seen today for establish care.  Diagnoses and all orders for this visit:  Screening examination for STD (sexually transmitted disease) -     HIV Antibody (routine testing w rflx); Future -     Urine cytology ancillary only([place]) -     RPR; Future -     HSV 1 antibody, IgG; Future -  HSV 2 antibody, IgG; Future  Mixed hyperlipidemia -     Lipid panel; Future -     fenofibrate 160 MG tablet; Take 1 tablet (160 mg total) by mouth daily.  Non-seasonal allergic rhinitis due to other allergic trigger  Allergic conjunctivitis of both eyes -     Propylene Glycol (SYSTANE COMPLETE) 0.6 % SOLN; Apply 1 drop to eye every 12 (twelve) hours as needed. -     fluticasone (FLONASE) 50 MCG/ACT nasal spray; Place 2 sprays into both nostrils daily.  Elevated liver enzymes -     Hepatic function panel; Future -     Propylene Glycol (SYSTANE COMPLETE) 0.6 % SOLN; Apply 1 drop to eye every 12 (twelve) hours as needed. -     fluticasone (FLONASE) 50 MCG/ACT nasal spray; Place 2 sprays into both nostrils daily.  Mass of oral cavity -     Ambulatory referral to ENT    No problem-specific Assessment & Plan notes found for this encounter.      Problem List Items Addressed This Visit    None    Visit Diagnoses    Screening examination for STD (sexually transmitted disease)    -  Primary   Relevant Orders   HIV Antibody (routine testing w rflx) (Completed)   Urine cytology ancillary only([place]) (Completed)   RPR (Completed)   HSV 1 antibody, IgG (Completed)   HSV 2 antibody, IgG (Completed)   Mixed hyperlipidemia       Relevant Medications   ezetimibe (ZETIA) 10 MG tablet   fenofibrate 160 MG tablet   Other Relevant Orders   Lipid panel (Completed)   Non-seasonal allergic rhinitis due to other allergic trigger       Allergic conjunctivitis of both eyes       Relevant Medications    Propylene Glycol (SYSTANE COMPLETE) 0.6 % SOLN   fluticasone (FLONASE) 50 MCG/ACT nasal spray   Elevated liver enzymes       Relevant Medications   Propylene Glycol (SYSTANE COMPLETE) 0.6 % SOLN   fluticasone (FLONASE) 50 MCG/ACT nasal spray   Other Relevant Orders   Hepatic function panel (Completed)   Mass of oral cavity       Relevant Orders   Ambulatory referral to ENT       Follow up: Return in about 6 months (around 12/20/2018) for hyperlipidemia (fasting).  Alysia Pennaharlotte , NP

## 2018-06-21 NOTE — Patient Instructions (Addendum)
Negative for HSV 2, RPR,and  HIV Positive for HSV 1 Lipid panel indicates elevated triglyceride, total cholesterol, and LDL. Need to resume fenofibrate and maintain DASH diet. Start exercise daily. F/up in 49months (fasting)  Sign medical release for colonoscopy report.  You will be contacted to schedule appt with ENT.   DASH Eating Plan DASH stands for "Dietary Approaches to Stop Hypertension." The DASH eating plan is a healthy eating plan that has been shown to reduce high blood pressure (hypertension). It may also reduce your risk for type 2 diabetes, heart disease, and stroke. The DASH eating plan may also help with weight loss. What are tips for following this plan?  General guidelines  Avoid eating more than 2,300 mg (milligrams) of salt (sodium) a day. If you have hypertension, you may need to reduce your sodium intake to 1,500 mg a day.  Limit alcohol intake to no more than 1 drink a day for nonpregnant women and 2 drinks a day for men. One drink equals 12 oz of beer, 5 oz of wine, or 1 oz of hard liquor.  Work with your health care provider to maintain a healthy body weight or to lose weight. Ask what an ideal weight is for you.  Get at least 30 minutes of exercise that causes your heart to beat faster (aerobic exercise) most days of the week. Activities may include walking, swimming, or biking.  Work with your health care provider or diet and nutrition specialist (dietitian) to adjust your eating plan to your individual calorie needs. Reading food labels   Check food labels for the amount of sodium per serving. Choose foods with less than 5 percent of the Daily Value of sodium. Generally, foods with less than 300 mg of sodium per serving fit into this eating plan.  To find whole grains, look for the word "whole" as the first word in the ingredient list. Shopping  Buy products labeled as "low-sodium" or "no salt added."  Buy fresh foods. Avoid canned foods and premade or  frozen meals. Cooking  Avoid adding salt when cooking. Use salt-free seasonings or herbs instead of table salt or sea salt. Check with your health care provider or pharmacist before using salt substitutes.  Do not fry foods. Cook foods using healthy methods such as baking, boiling, grilling, and broiling instead.  Cook with heart-healthy oils, such as olive, canola, soybean, or sunflower oil. Meal planning  Eat a balanced diet that includes: ? 5 or more servings of fruits and vegetables each day. At each meal, try to fill half of your plate with fruits and vegetables. ? Up to 6-8 servings of whole grains each day. ? Less than 6 oz of lean meat, poultry, or fish each day. A 3-oz serving of meat is about the same size as a deck of cards. One egg equals 1 oz. ? 2 servings of low-fat dairy each day. ? A serving of nuts, seeds, or beans 5 times each week. ? Heart-healthy fats. Healthy fats called Omega-3 fatty acids are found in foods such as flaxseeds and coldwater fish, like sardines, salmon, and mackerel.  Limit how much you eat of the following: ? Canned or prepackaged foods. ? Food that is high in trans fat, such as fried foods. ? Food that is high in saturated fat, such as fatty meat. ? Sweets, desserts, sugary drinks, and other foods with added sugar. ? Full-fat dairy products.  Do not salt foods before eating.  Try to eat at least 2  vegetarian meals each week.  Eat more home-cooked food and less restaurant, buffet, and fast food.  When eating at a restaurant, ask that your food be prepared with less salt or no salt, if possible. What foods are recommended? The items listed may not be a complete list. Talk with your dietitian about what dietary choices are best for you. Grains Whole-grain or whole-wheat bread. Whole-grain or whole-wheat pasta. Brown rice. [place]. Bulgur. Whole-grain and low-sodium cereals. Pita bread. Low-fat, low-sodium crackers. Whole-wheat flour  tortillas. Vegetables Fresh or frozen vegetables (raw, steamed, roasted, or grilled). Low-sodium or reduced-sodium tomato and vegetable juice. Low-sodium or reduced-sodium tomato sauce and tomato paste. Low-sodium or reduced-sodium canned vegetables. Fruits All fresh, dried, or frozen fruit. Canned fruit in natural juice (without added sugar). Meat and other protein foods Skinless chicken or [place]. Ground chicken or [place]. Pork with fat trimmed off. Fish and seafood. Egg whites. Dried beans, peas, or lentils. Unsalted nuts, nut butters, and seeds. Unsalted canned beans. Lean cuts of beef with fat trimmed off. Low-sodium, lean deli meat. Dairy Low-fat (1%) or fat-free (skim) milk. Fat-free, low-fat, or reduced-fat cheeses. Nonfat, low-sodium ricotta or cottage cheese. Low-fat or nonfat yogurt. Low-fat, low-sodium cheese. Fats and oils Soft margarine without trans fats. Vegetable oil. Low-fat, reduced-fat, or light mayonnaise and salad dressings (reduced-sodium). Canola, safflower, olive, soybean, and sunflower oils. Avocado. Seasoning and other foods Herbs. Spices. Seasoning mixes without salt. Unsalted popcorn and pretzels. Fat-free sweets. What foods are not recommended? The items listed may not be a complete list. Talk with your dietitian about what dietary choices are best for you. Grains Baked goods made with fat, such as croissants, muffins, or some breads. Dry pasta or rice meal packs. Vegetables Creamed or fried vegetables. Vegetables in a cheese sauce. Regular canned vegetables (not low-sodium or reduced-sodium). Regular canned tomato sauce and paste (not low-sodium or reduced-sodium). Regular tomato and vegetable juice (not low-sodium or reduced-sodium). Angie Fava. Olives. Fruits Canned fruit in a light or heavy syrup. Fried fruit. Fruit in cream or butter sauce. Meat and other protein foods Fatty cuts of meat. Ribs. Fried meat. Berniece Salines. Sausage. Bologna and other processed lunch meats.  Salami. Fatback. Hotdogs. Bratwurst. Salted nuts and seeds. Canned beans with added salt. Canned or smoked fish. Whole eggs or egg yolks. Chicken or [place] with skin. Dairy Whole or 2% milk, cream, and half-and-half. Whole or full-fat cream cheese. Whole-fat or sweetened yogurt. Full-fat cheese. Nondairy creamers. Whipped toppings. Processed cheese and cheese spreads. Fats and oils Butter. Stick margarine. Lard. Shortening. Ghee. Bacon fat. Tropical oils, such as coconut, palm kernel, or palm oil. Seasoning and other foods Salted popcorn and pretzels. Onion salt, garlic salt, seasoned salt, table salt, and sea salt. Worcestershire sauce. Tartar sauce. Barbecue sauce. Teriyaki sauce. Soy sauce, including reduced-sodium. Steak sauce. Canned and packaged gravies. Fish sauce. Oyster sauce. Cocktail sauce. Horseradish that you find on the shelf. Ketchup. Mustard. Meat flavorings and tenderizers. Bouillon cubes. Hot sauce and Tabasco sauce. Premade or packaged marinades. Premade or packaged taco seasonings. Relishes. Regular salad dressings. Where to find more information:  National Heart, Lung, and [place]: https://wilson-eaton.com/  American Heart Association: www.heart.org Summary  The DASH eating plan is a healthy eating plan that has been shown to reduce high blood pressure (hypertension). It may also reduce your risk for type 2 diabetes, heart disease, and stroke.  With the DASH eating plan, you should limit salt (sodium) intake to 2,300 mg a day. If you have hypertension, you may need  to reduce your sodium intake to 1,500 mg a day.  When on the DASH eating plan, aim to eat more fresh fruits and vegetables, whole grains, lean proteins, low-fat dairy, and heart-healthy fats.  Work with your health care provider or diet and nutrition specialist (dietitian) to adjust your eating plan to your individual calorie needs. This information is not intended to replace advice given to you by your health  care provider. Make sure you discuss any questions you have with your health care provider. Document Released: 05/22/2011 Document Revised: 05/26/2016 Document Reviewed: 05/26/2016 Elsevier Interactive Patient Education  2019 [employer].

## 2018-06-22 ENCOUNTER — Other Ambulatory Visit (INDEPENDENT_AMBULATORY_CARE_PROVIDER_SITE_OTHER): Payer: 59

## 2018-06-22 DIAGNOSIS — Z113 Encounter for screening for infections with a predominantly sexual mode of transmission: Secondary | ICD-10-CM

## 2018-06-22 DIAGNOSIS — E782 Mixed hyperlipidemia: Secondary | ICD-10-CM | POA: Diagnosis not present

## 2018-06-22 DIAGNOSIS — R748 Abnormal levels of other serum enzymes: Secondary | ICD-10-CM | POA: Diagnosis not present

## 2018-06-22 LAB — HEPATIC FUNCTION PANEL
ALBUMIN: 4.7 g/dL (ref 3.5–5.2)
ALK PHOS: 54 U/L (ref 39–117)
ALT: 53 U/L (ref 0–53)
AST: 32 U/L (ref 0–37)
BILIRUBIN TOTAL: 0.7 mg/dL (ref 0.2–1.2)
Bilirubin, Direct: 0.1 mg/dL (ref 0.0–0.3)
Total Protein: 7 g/dL (ref 6.0–8.3)

## 2018-06-22 LAB — URINE CYTOLOGY ANCILLARY ONLY
Chlamydia: NEGATIVE
Neisseria Gonorrhea: NEGATIVE
Trichomonas: NEGATIVE

## 2018-06-22 LAB — LIPID PANEL
Cholesterol: 305 mg/dL — ABNORMAL HIGH (ref 0–200)
HDL: 37.4 mg/dL — ABNORMAL LOW
NonHDL: 267.58
Total CHOL/HDL Ratio: 8
Triglycerides: 219 mg/dL — ABNORMAL HIGH (ref 0.0–149.0)
VLDL: 43.8 mg/dL — ABNORMAL HIGH (ref 0.0–40.0)

## 2018-06-22 LAB — LDL CHOLESTEROL, DIRECT: Direct LDL: 219 mg/dL

## 2018-06-23 ENCOUNTER — Encounter: Payer: Self-pay | Admitting: Nurse Practitioner

## 2018-06-23 LAB — HIV ANTIBODY (ROUTINE TESTING W REFLEX): HIV 1&2 Ab, 4th Generation: NONREACTIVE

## 2018-06-23 LAB — HSV 1 ANTIBODY, IGG: HSV 1 Glycoprotein G Ab, IgG: 34.5 index — ABNORMAL HIGH

## 2018-06-23 LAB — HSV 2 ANTIBODY, IGG: HSV 2 Glycoprotein G Ab, IgG: <0.9 index

## 2018-06-23 LAB — RPR: RPR Ser Ql: NONREACTIVE

## 2018-06-23 MED ORDER — FENOFIBRATE 160 MG PO TABS: 160.0000 mg | ORAL_TABLET | Freq: Every day | ORAL | 1 refills | Status: DC

## 2018-06-24 ENCOUNTER — Other Ambulatory Visit: Payer: Self-pay | Admitting: Nurse Practitioner

## 2018-06-24 DIAGNOSIS — E782 Mixed hyperlipidemia: Secondary | ICD-10-CM

## 2018-06-24 MED ORDER — FENOFIBRATE 160 MG PO TABS: 160.0000 mg | ORAL_TABLET | Freq: Every day | ORAL | 1 refills | Status: DC

## 2018-07-07 ENCOUNTER — Telehealth: Payer: Self-pay | Admitting: Nurse Practitioner

## 2018-07-07 DIAGNOSIS — K1379 Other lesions of oral mucosa: Secondary | ICD-10-CM | POA: Diagnosis not present

## 2018-07-07 DIAGNOSIS — K115 Sialolithiasis: Secondary | ICD-10-CM | POA: Diagnosis not present

## 2018-07-07 NOTE — Telephone Encounter (Signed)
Referral coordinator please help, referral was place on 06/21/2018.     Copied from CRM [id number]. Topic: Referral - Status >> Jul 07, 2018 12:53 PM Darletta Moll L wrote: Reason for CRM: Patients new insurance is not accepted at the [facility] referred [facility] to. Please call patient to discuss new location to refer to.

## 2018-07-07 NOTE — Telephone Encounter (Signed)
I called and spoke to patient. Patient was seen by [facility] today, patient was referred to this office. Patient stated that he was told that they could not do his imaging/x-ray due to him having [facility] insurance at [facility] imaging facility. I called and spoke to Amy @ [facility], patient was informed that due to him having [facility] insurance they would send him to have imaging done @ a [facility] facility. Per there office they are handling the imaging order and precert and our office does not need to do anything further. I called and informed patient of this information that I received from Amy. Patient also asking questions about his copay at that office, I informed patient he will have to discuss this with there office.

## 2018-07-08 ENCOUNTER — Encounter: Payer: Self-pay | Admitting: Nurse Practitioner

## 2018-07-08 ENCOUNTER — Ambulatory Visit (HOSPITAL_BASED_OUTPATIENT_CLINIC_OR_DEPARTMENT_OTHER)
Admission: RE | Admit: 2018-07-08 | Discharge: 2018-07-08 | Disposition: A | Discharge status: Home / Self Care | Payer: 59 | Source: Ambulatory Visit | Attending: Otolaryngology | Admitting: Otolaryngology

## 2018-07-08 ENCOUNTER — Other Ambulatory Visit (HOSPITAL_BASED_OUTPATIENT_CLINIC_OR_DEPARTMENT_OTHER): Payer: Self-pay | Admitting: Otolaryngology

## 2018-07-08 DIAGNOSIS — K115 Sialolithiasis: Secondary | ICD-10-CM | POA: Diagnosis not present

## 2018-07-08 MED ORDER — IOPAMIDOL (ISOVUE-300) INJECTION 61%
100.0000 mL | Freq: Once | INTRAVENOUS | Status: AC | PRN
  Administered 2018-07-08: 75 mL via INTRAVENOUS

## 2018-07-08 NOTE — Progress Notes (Signed)
Abstracted result and sent to scan  

## 2018-08-03 ENCOUNTER — Encounter: Payer: Self-pay | Admitting: Nurse Practitioner

## 2018-08-04 ENCOUNTER — Telehealth: Payer: Self-pay | Admitting: Nurse Practitioner

## 2018-08-04 ENCOUNTER — Other Ambulatory Visit: Payer: Self-pay | Admitting: Nurse Practitioner

## 2018-08-04 DIAGNOSIS — H1013 Acute atopic conjunctivitis, bilateral: Secondary | ICD-10-CM

## 2018-08-04 DIAGNOSIS — R748 Abnormal levels of other serum enzymes: Secondary | ICD-10-CM

## 2018-08-04 MED ORDER — FLUTICASONE PROPIONATE 50 MCG/ACT NA SUSP: 2.0000 | Freq: Every day | NASAL | 5 refills | Status: DC

## 2018-08-04 MED FILL — FLUTICASONE PROP 50 MCG SPR: 50 | 30 days supply | Qty: 16 | Fill #0

## 2018-08-04 NOTE — Telephone Encounter (Signed)
Copied from CRM [id number]. Topic: Quick Communication - See Telephone Encounter >> Aug 04, 2018  1:01 PM Angela Nevin wrote: CRM for notification. See Telephone encounter for: 08/04/18.  Patient was advised by pharamcy to contact office to get fluticasone (FLONASE) 50 MCG/ACT nasal spray transferred from [facility] on file to [facility]. Please advise.

## 2018-08-04 NOTE — Telephone Encounter (Signed)
Rx cancel from [facility] and resend to [facility].

## 2018-08-19 DIAGNOSIS — K115 Sialolithiasis: Secondary | ICD-10-CM | POA: Diagnosis not present

## 2018-08-25 MED FILL — FENOFIBRATE 160 MG TABLET: 160 | 90 days supply | Qty: 90 | Fill #0

## 2018-10-04 ENCOUNTER — Telehealth: Payer: Self-pay | Admitting: Nurse Practitioner

## 2018-10-04 NOTE — Telephone Encounter (Signed)
Called and talked with patient. Patient states that he is out of town at the moment and will call back to schedule appt.

## 2019-01-31 MED FILL — FENOFIBRATE 160 MG TABLET: 160 | 30 days supply | Qty: 30 | Fill #1

## 2019-06-09 IMAGING — CT CT NECK W/ CM
4 of 5 series · 15 of 33 positions shown, 17 images · IV contrast (iopamidol)
Comparison: CT HEAD March 29, 2010

CLINICAL DATA: Sublingual lesions, assess for salivary stones.

EXAM:
CT NECK WITH CONTRAST
TECHNIQUE: Multidetector CT imaging of the neck was performed using the
standard protocol following the bolus administration of intravenous
contrast.
CONTRAST:  75mL [id number] IOPAMIDOL ([id number]) INJECTION 61%

[Series 3: axial neck · axial · 0.48mm/px · z∈[+950,+1132]mm · 4 of 153 slices shown, 5 images]
[im 31/153  soft-tissue]
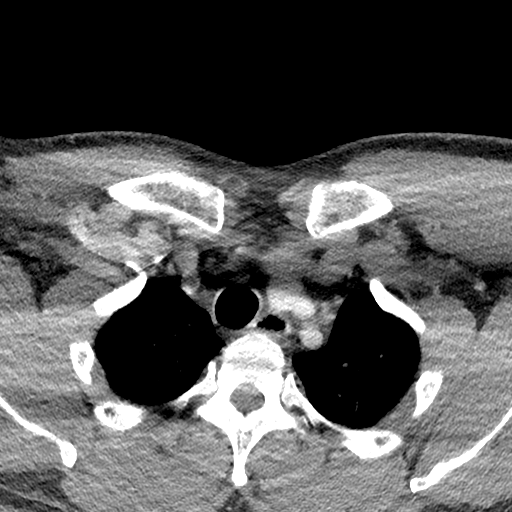
[im 31/153  bone]
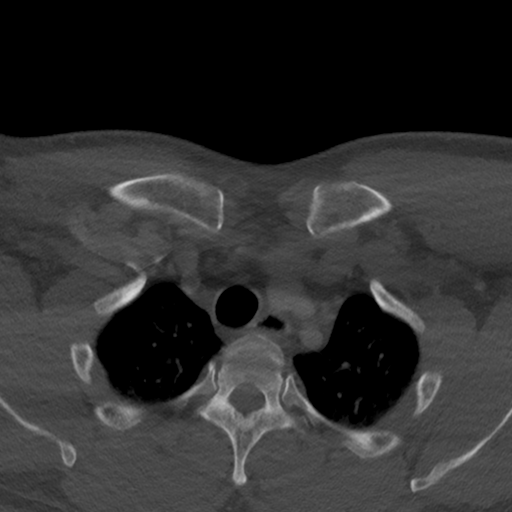
[im 61/153  bone]
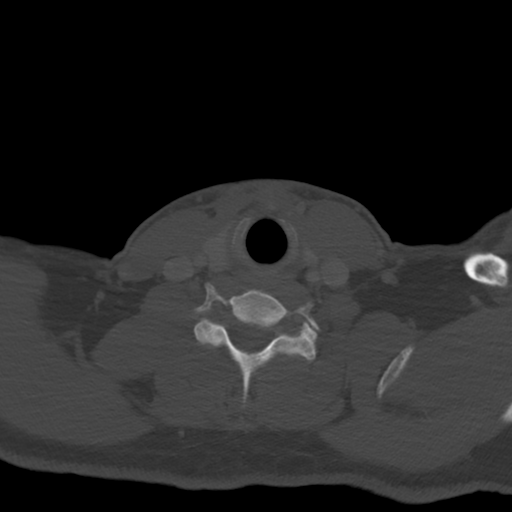
[im 92/153  bone]
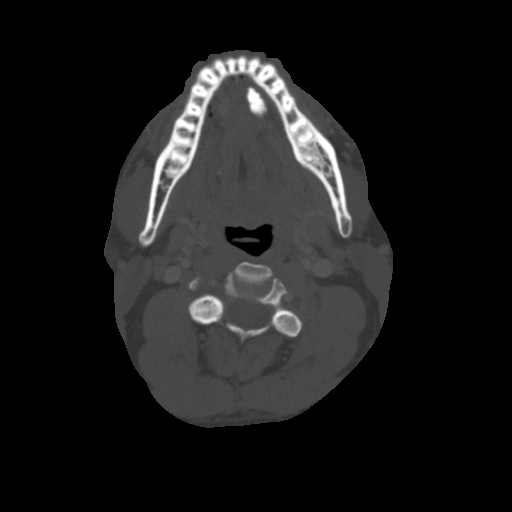
[im 122/153  bone]
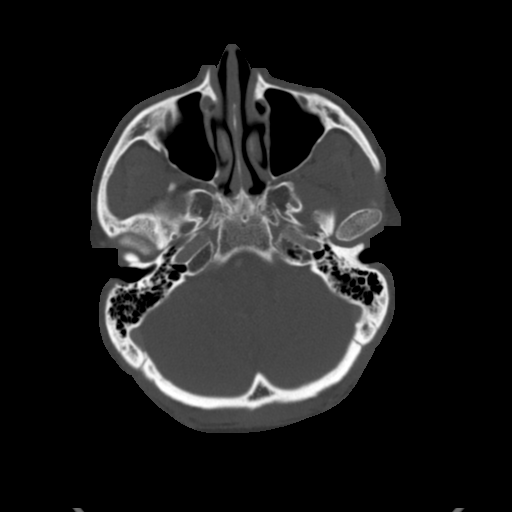

[Series 7: sag neck · sagittal · 0.54mm/px · 5 of 116 slices shown, 6 images]
[im 39/116  bone]
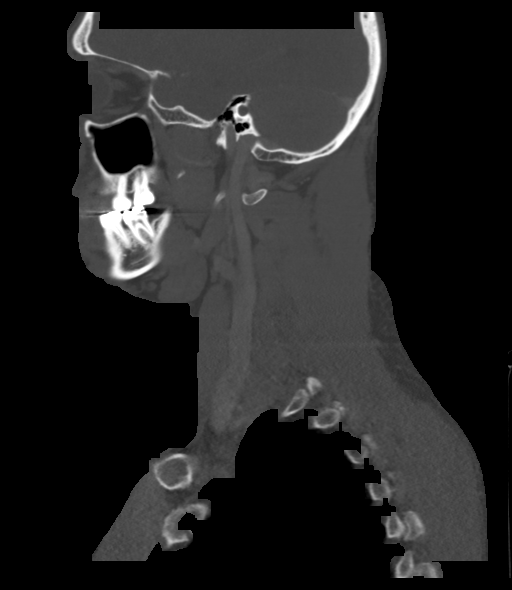
[im 48/116  bone]
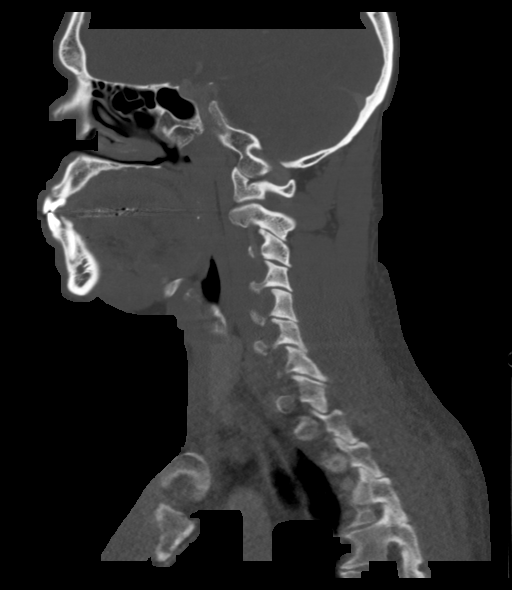
[im 58/116  soft-tissue]
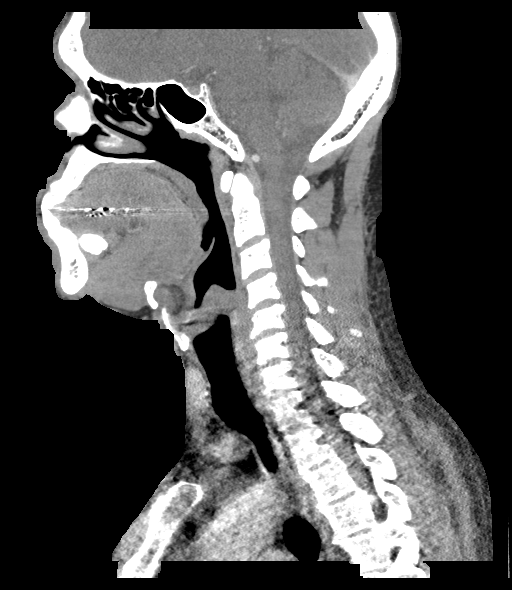
[im 58/116  bone]
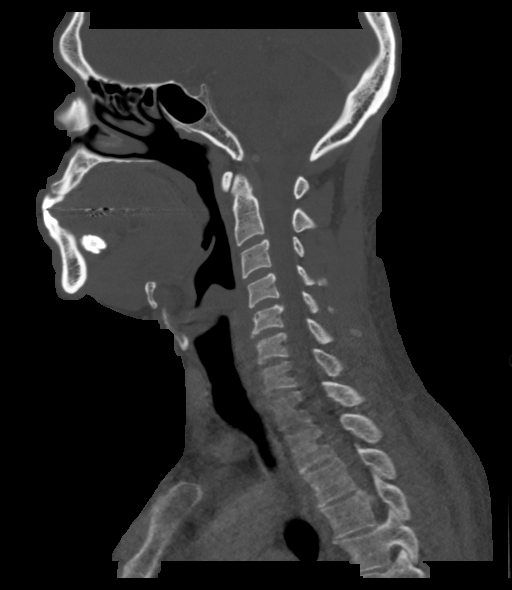
[im 68/116  bone]
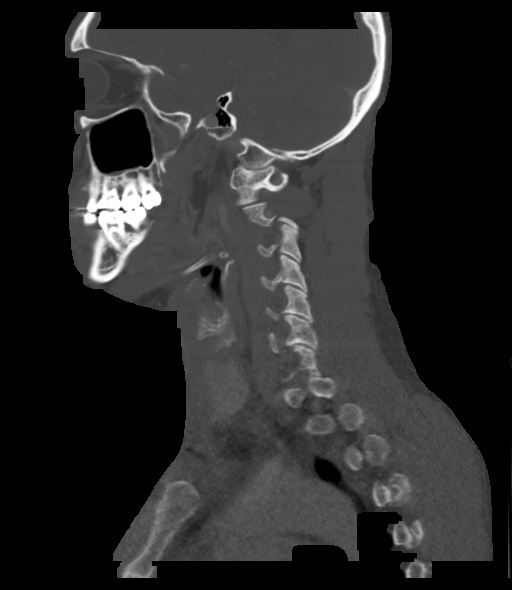
[im 77/116  bone]
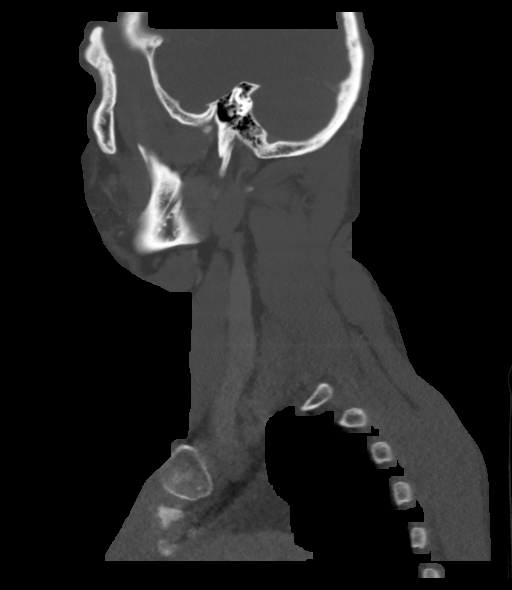

[Series 8: cor neck · coronal · 0.65mm/px · 3 of 138 slices shown]
[im 28/138  bone]
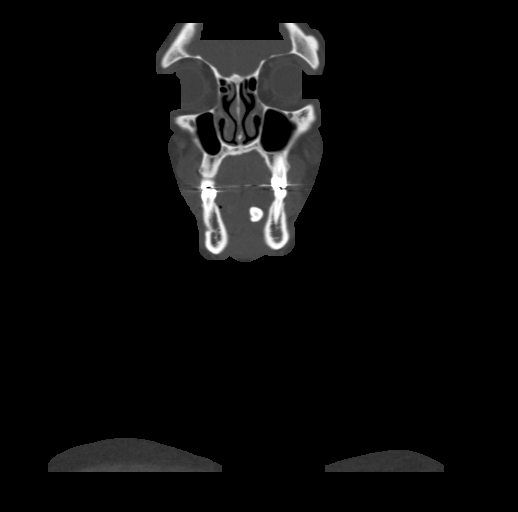
[im 55/138  bone]
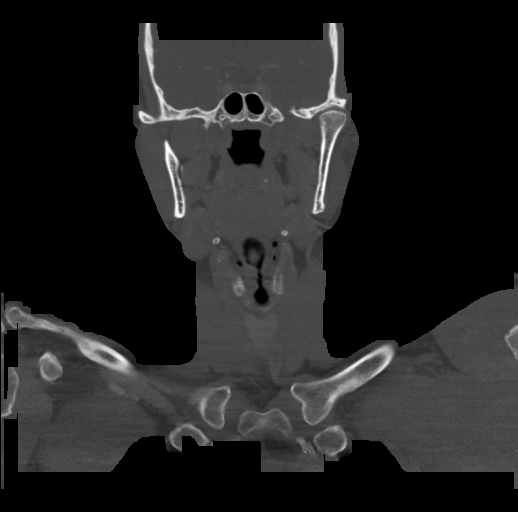
[im 83/138  bone]
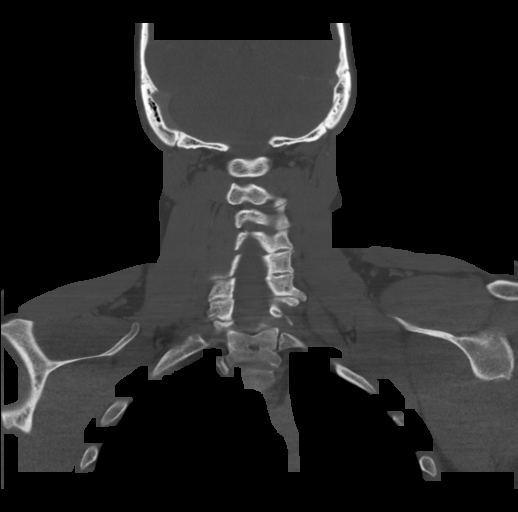

[Series 9: orthogonal ax · axial · 0.42mm/px · z∈[+948,+1070]mm · 3 of 154 slices shown]
[im 31/154  bone]
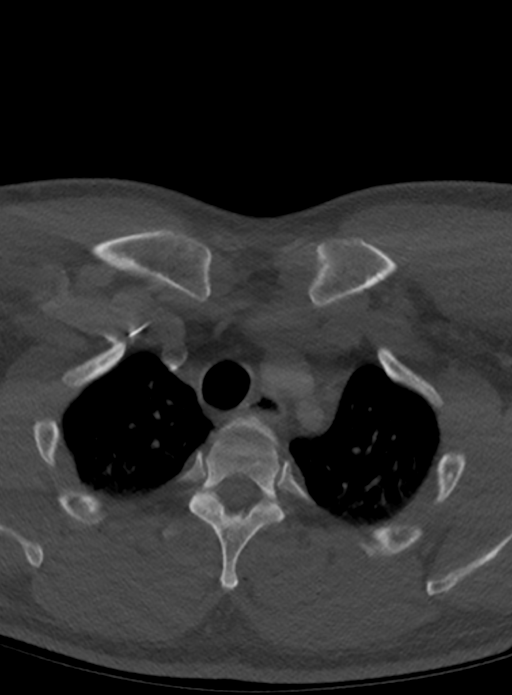
[im 62/154  bone]
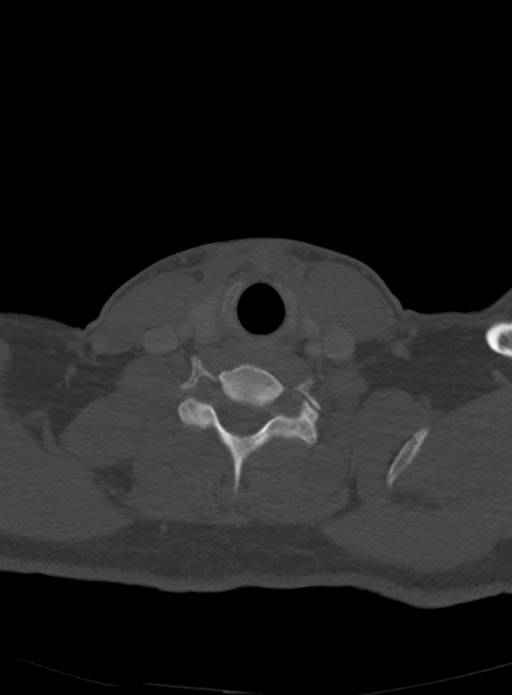
[im 92/154  bone]
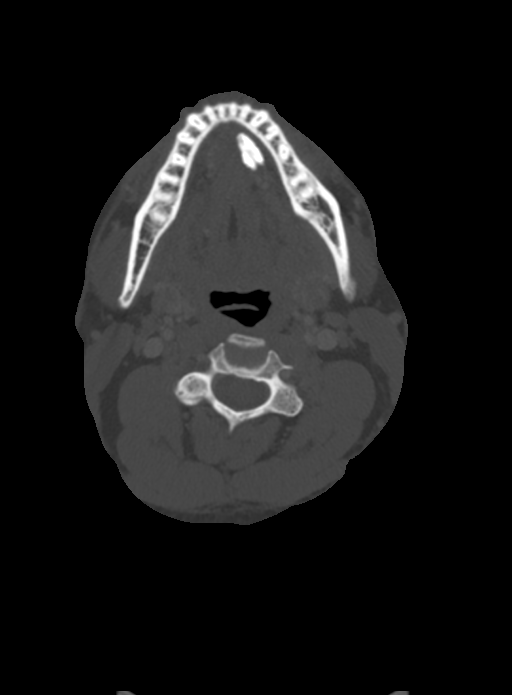

[15 of 33 positions shown; findings below may reference images not displayed]

FINDINGS: PHARYNX AND LARYNX: Normal.  Widely patent airway.

SALIVARY GLANDS: Triangular 8 x 14 mm distal LEFT sublingual gland
sialolith. Normal appearance of submandibular and parotid glands. No
ductal dilatation.

THYROID: Normal.

LYMPH NODES: No lymphadenopathy by CT size criteria.

VASCULAR: Normal.

LIMITED INTRACRANIAL: Normal.

VISUALIZED ORBITS: Normal.

MASTOIDS AND VISUALIZED PARANASAL SINUSES: Well-aerated.

SKELETON: Nonacute. Small LEFT frontal exostosis. Small frontal
calvarial ground-glass density is unchanged, possible fibrous
dysplasia.

UPPER CHEST: Lung apices are clear. No superior mediastinal
lymphadenopathy.

OTHER: None.
IMPRESSION: 1. 8 x 14 mm LEFT sublingual sialolith without acute sialoadenitis.

## 2020-05-03 ENCOUNTER — Other Ambulatory Visit: Payer: Self-pay

## 2020-05-03 ENCOUNTER — Encounter: Payer: Self-pay | Admitting: Nurse Practitioner

## 2020-05-03 ENCOUNTER — Ambulatory Visit (INDEPENDENT_AMBULATORY_CARE_PROVIDER_SITE_OTHER): Payer: Self-pay | Admitting: Nurse Practitioner

## 2020-05-03 VITALS — BP 120/82 | HR 68 | Temp 96.9°F | Ht 74.0 in | Wt 233.2 lb

## 2020-05-03 DIAGNOSIS — M6788 Other specified disorders of synovium and tendon, other site: Secondary | ICD-10-CM

## 2020-05-03 MED ORDER — NAPROXEN 500 MG PO TABS: 500.0000 mg | ORAL_TABLET | Freq: Two times a day (BID) | ORAL | 0 refills | Status: DC

## 2020-05-03 NOTE — Patient Instructions (Addendum)
Use cold compress 2-3times a day, x each time Use approx. 42mm heel lift in all shoes. Wear only tennis shoes.  You will be contacted to schedule appt with podiatry.

## 2020-05-03 NOTE — Progress Notes (Signed)
Subjective:  Patient ID: Christian Lozano, male    DOB: [birth year]  Age: 54 y.o. MRN: [id number]  CC: Acute Visit (Pt c/o a knot on the right ankle x2-3 months if not more. Pt states it is painful to the touch and states he also has pain in the hill of his foot. Pt states he has put a heating pad on it which cause it to decrease in size but states it eventually returns. Declines flu vaccine)  Ankle Pain  Incident onset: ongoing for 2months. The injury mechanism is unknown. The pain is present in the right ankle. The quality of the pain is described as aching and shooting. The pain is severe. The pain has been fluctuating since onset. Associated symptoms include an inability to bear weight. Pertinent negatives include no loss of motion, loss of sensation, muscle weakness, numbness or tingling. He reports no foreign bodies present. The symptoms are aggravated by movement, palpation and weight bearing. He has tried nothing for the symptoms.   Reviewed past Medical, Social and Family history today.  Outpatient Medications Prior to Visit  Medication Sig Dispense Refill   ezetimibe (ZETIA) 10 MG tablet Take by mouth. (Patient not taking: Reported on 05/03/2020)     fenofibrate 160 MG tablet Take 1 tablet (160 mg total) by mouth daily. (Patient not taking: Reported on 05/03/2020) 90 tablet 1   fluticasone (FLONASE) 50 MCG/ACT nasal spray Place 2 sprays into both nostrils daily. (Patient not taking: Reported on 05/03/2020) 16 g 5   Guaifenesin 1200 MG TB12 Take 1 tablet (1,200 mg total) by mouth 2 (two) times daily. (Patient not taking: Reported on 06/21/2018) 20 each 0   predniSONE (DELTASONE) 50 MG tablet Take 1 tablet (50 mg total) by mouth daily. (Patient not taking: Reported on 06/21/2018) 5 tablet 0   promethazine-dextromethorphan (PROMETHAZINE-DM) 6.25-15 MG/5ML syrup Take 5 mLs by mouth 4 (four) times daily as needed for cough. (Patient not taking: Reported on 06/21/2018) 120 mL 0   Propylene Glycol  (SYSTANE COMPLETE) 0.6 % SOLN Apply 1 drop to eye every 12 (twelve) hours as needed. (Patient not taking: Reported on 05/03/2020)     No facility-administered medications prior to visit.    ROS See HPI  Objective:  BP 120/82 (BP Location: Left Arm, Patient Position: Sitting, Cuff Size: Large)    Pulse 68    Temp (!) 96.9 F (36.1 C) (Temporal)    Ht 6\' 2"  (1.88 m)    Wt 233 lb 3.2 oz (105.8 kg)    SpO2 97%    BMI 29.94 kg/m   Physical Exam Vitals reviewed.  Cardiovascular:     Pulses:          Posterior tibial pulses are 2+ on the right side and 2+ on the left side.  Musculoskeletal:        General: Swelling and tenderness present.     Right lower leg: No edema.     Left lower leg: No edema.     Right ankle:     Right Achilles Tendon: Tenderness present.     Left ankle: Normal.     Left Achilles Tendon: Normal.       Feet:  Feet:     Right foot:     Skin integrity: Skin integrity normal.     Left foot:     Skin integrity: Skin integrity normal.  Skin:    Findings: No erythema or rash.  Neurological:     Mental Status: He is alert  and oriented to person, place, and time.    Assessment & Plan:  This visit occurred during the SARS-CoV-2 public health emergency.  Safety protocols were in place, including screening questions prior to the visit, additional usage of staff PPE, and extensive cleaning of exam room while observing appropriate contact time as indicated for disinfecting solutions.   Stephenson was seen today for acute visit.  Diagnoses and all orders for this visit:  Achilles tendonosis of right lower extremity -     Ambulatory referral to Podiatry -     naproxen (NAPROSYN) 500 MG tablet; Take 1 tablet (500 mg total) by mouth 2 (two) times daily with a meal.    Problem List Items Addressed This Visit    None    Visit Diagnoses    Achilles tendonosis of right lower extremity    -  Primary   Relevant Medications   naproxen (NAPROSYN) 500 MG tablet   Other  Relevant Orders   Ambulatory referral to Podiatry      Follow-up: No follow-ups on file.  Alysia Penna, NP

## 2020-05-21 ENCOUNTER — Telehealth: Payer: Self-pay

## 2020-05-21 NOTE — Telephone Encounter (Signed)
  LAST APPOINTMENT DATE: 05/03/2020   NEXT APPOINTMENT DATE:@Visit  date not found  MEDICATION:Propylene Glycol (SYSTANE COMPLETE) 0.6 % SOLN  PHARMACY: [facility] - [place], [place] - [address]    Please advise

## 2020-05-22 ENCOUNTER — Other Ambulatory Visit: Payer: Self-pay

## 2020-05-22 ENCOUNTER — Other Ambulatory Visit: Payer: Self-pay | Admitting: Nurse Practitioner

## 2020-05-22 DIAGNOSIS — H1013 Acute atopic conjunctivitis, bilateral: Secondary | ICD-10-CM

## 2020-05-22 DIAGNOSIS — R748 Abnormal levels of other serum enzymes: Secondary | ICD-10-CM

## 2020-05-22 MED ORDER — SYSTANE COMPLETE 0.6 % OP SOLN: 1.0000 [drp] | Freq: Two times a day (BID) | OPHTHALMIC | Status: DC | PRN

## 2020-05-22 MED ORDER — SYSTANE COMPLETE 0.6 % OP SOLN: 1.0000 [drp] | Freq: Two times a day (BID) | OPHTHALMIC | 3 refills | Status: DC | PRN

## 2020-05-22 MED FILL — SYSTANE COMPLETE 0.6 % SOLN: 0.6 | 90 days supply | Qty: 10 | Fill #0

## 2020-05-22 NOTE — Telephone Encounter (Signed)
Patient is calling to check the status of his refill. Please give him a call back if/when this has been sent in.

## 2020-05-22 NOTE — Telephone Encounter (Signed)
Ok to refill 

## 2020-05-22 NOTE — Telephone Encounter (Signed)
Done patient aware Rx sent in.

## 2021-02-06 ENCOUNTER — Other Ambulatory Visit (HOSPITAL_BASED_OUTPATIENT_CLINIC_OR_DEPARTMENT_OTHER): Payer: Self-pay

## 2021-02-06 MED FILL — Propylene Glycol Ophth Soln 0.6%: OPHTHALMIC | 50 days supply | Qty: 10 | Fill #0 | Status: CN

## 2021-02-07 ENCOUNTER — Other Ambulatory Visit (HOSPITAL_BASED_OUTPATIENT_CLINIC_OR_DEPARTMENT_OTHER): Payer: Self-pay

## 2021-02-21 ENCOUNTER — Other Ambulatory Visit (HOSPITAL_BASED_OUTPATIENT_CLINIC_OR_DEPARTMENT_OTHER): Payer: Self-pay

## 2021-04-03 ENCOUNTER — Other Ambulatory Visit (HOSPITAL_BASED_OUTPATIENT_CLINIC_OR_DEPARTMENT_OTHER): Payer: Self-pay

## 2021-04-03 MED FILL — Propylene Glycol Ophth Soln 0.6%: OPHTHALMIC | 50 days supply | Qty: 10 | Fill #0 | Status: AC

## 2021-04-04 ENCOUNTER — Other Ambulatory Visit (HOSPITAL_BASED_OUTPATIENT_CLINIC_OR_DEPARTMENT_OTHER): Payer: Self-pay

## 2021-05-22 ENCOUNTER — Other Ambulatory Visit: Payer: Self-pay

## 2021-05-22 ENCOUNTER — Ambulatory Visit (INDEPENDENT_AMBULATORY_CARE_PROVIDER_SITE_OTHER): Payer: Self-pay | Admitting: Nurse Practitioner

## 2021-05-22 ENCOUNTER — Encounter: Payer: Self-pay | Admitting: Nurse Practitioner

## 2021-05-22 VITALS — BP 116/76 | HR 88 | Temp 97.8°F | Ht 72.0 in | Wt 238.0 lb

## 2021-05-22 DIAGNOSIS — Z125 Encounter for screening for malignant neoplasm of prostate: Secondary | ICD-10-CM

## 2021-05-22 DIAGNOSIS — Z0001 Encounter for general adult medical examination with abnormal findings: Secondary | ICD-10-CM

## 2021-05-22 DIAGNOSIS — E7800 Pure hypercholesterolemia, unspecified: Secondary | ICD-10-CM

## 2021-05-22 DIAGNOSIS — D72819 Decreased white blood cell count, unspecified: Secondary | ICD-10-CM

## 2021-05-22 NOTE — Progress Notes (Signed)
Subjective:    Patient ID: Christian Lozano, male    DOB: 1966/06/11, 55 y.o.   MRN: [id number]  Patient presents today for CPE and eval of chronic conditions   HPI Pure hypercholesterolemia Reports daytime somnolence with fenofibrate. Reports he has made dietary modifications (low fat and low carb diet) No change in activity level. Smoke 1cigar per month ETOH consumption once a month (1-2beers)  repeat lipid panel today Maintain heart healthy diet and add daily exercise  Vision:up to date Dental:up to date Diet:low fat diet Exercise:none Weight:  Wt Readings from Last 3 Encounters:  05/22/21 238 lb (108 kg)  05/03/20 233 lb 3.2 oz (105.8 kg)  06/21/18 236 lb 6.4 oz (107.2 kg)    Sexual History (orientation,birth control, marital status, STD):no need for STD screen, up to date with colonoscopy, no change in GI/GU function  Depression/Suicide: Depression screen [facility] 2/9 05/22/2021 06/21/2018  Decreased Interest 0 0  Down, Depressed, Hopeless 0 0  PHQ - 2 Score 0 0   Immunizations: (TDAP, Hep C screen, Pneumovax, Influenza, zoster)  Health Maintenance  Topic Date Due   COVID-19 Vaccine (1) 06/07/2021*   Zoster (Shingles) Vaccine (1 of 2) 08/20/2021*   Flu Shot  09/13/2021*   Pneumococcal Vaccination (1 - PCV) 05/22/2022*   Tetanus Vaccine  05/22/2022*   Hepatitis C Screening: USPSTF Recommendation to screen - Ages 18-79 yo.  05/22/2022*   Colon Cancer Screening  10/06/2027   HIV Screening  Completed   HPV Vaccine  Aged Out  *Topic was postponed. The date shown is not the original due date.   Fall Risk: Fall Risk  05/22/2021 06/21/2018  Falls in the past year? 0 0  Number falls in past yr: 0 -  Injury with Fall? 0 -  Risk for fall due to : No Fall Risks -  Follow up Falls evaluation completed -   Medications and allergies reviewed with patient and updated if appropriate.  Patient Active Problem List   Diagnosis Date Noted   Pure hypercholesterolemia 01/06/2018     Current Outpatient Medications on File Prior to Visit  Medication Sig Dispense Refill   Propylene Glycol 0.6 % SOLN INSTILL 1 DROP INTO AFFECTED EYE EVERY 12 HOURS AS NEEDED 10 mL 3   fluticasone (FLONASE) 50 MCG/ACT nasal spray Place 2 sprays into both nostrils daily. (Patient not taking: Reported on 05/03/2020) 16 g 5   No current facility-administered medications on file prior to visit.    Past Medical History:  Diagnosis Date   Hyperlipidemia    Past Surgical History:  Procedure Laterality Date   BACK SURGERY     BACK SURGERY     HAND SURGERY     Social History   Socioeconomic History   Marital status: Married    Spouse name: Not on file   Number of children: Not on file   Years of education: Not on file   Highest education level: Not on file  Occupational History   Not on file  Tobacco Use   Smoking status: Some Days    Types: Cigars   Smokeless tobacco: Never   Tobacco comments:    once in blue moon  Vaping Use   Vaping Use: Never used  Substance and Sexual Activity   Alcohol use: Yes    Alcohol/week: 2.0 standard drinks    Types: 1 Glasses of wine, 1 Cans of beer per week    Comment: socially   Drug use: No   Sexual activity: Yes  Birth control/protection: None  Other Topics Concern   Not on file  Social History Narrative   Not on file   Social Determinants of Health   Financial Resource Strain: Not on file  Food Insecurity: Not on file  Transportation Needs: Not on file  Physical Activity: Not on file  Stress: Not on file  Social Connections: Not on file   Family History  Problem Relation Age of Onset   HIV/AIDS Mother    Diabetes Maternal Grandmother    Diabetes Maternal Uncle         Review of Systems  Constitutional:  Negative for fever, malaise/fatigue and weight loss.  HENT:  Negative for congestion and sore throat.   Eyes:        Negative for visual changes  Respiratory:  Negative for cough and shortness of breath.    Cardiovascular:  Negative for chest pain, palpitations and leg swelling.  Gastrointestinal:  Negative for blood in stool, constipation, diarrhea and heartburn.  Genitourinary:  Negative for dysuria, frequency and urgency.  Musculoskeletal:  Negative for falls, joint pain and myalgias.  Skin:  Negative for rash.  Neurological:  Negative for dizziness, sensory change and headaches.  Endo/Heme/Allergies:  Does not bruise/bleed easily.  Psychiatric/Behavioral:  Negative for depression, substance abuse and suicidal ideas. The patient is not nervous/anxious.    Objective:   Vitals:   05/22/21 1324  BP: 116/76  Pulse: 88  Temp: 97.8 F (36.6 C)  SpO2: 96%    Body mass index is 32.28 kg/m.   Physical Examination:  Physical Exam Vitals reviewed.  Constitutional:      General: He is not in acute distress.    Appearance: He is well-developed.  HENT:     Right Ear: Tympanic membrane, ear canal and external ear normal.     Left Ear: Tympanic membrane, ear canal and external ear normal.  Eyes:     Extraocular Movements: Extraocular movements intact.     Conjunctiva/sclera: Conjunctivae normal.  Cardiovascular:     Rate and Rhythm: Normal rate and regular rhythm.     Pulses: Normal pulses.     Heart sounds: Normal heart sounds.  Pulmonary:     Effort: Pulmonary effort is normal. No respiratory distress.     Breath sounds: Normal breath sounds.  Chest:     Chest wall: No tenderness.  Abdominal:     General: Bowel sounds are normal.     Palpations: Abdomen is soft.  Musculoskeletal:        General: Normal range of motion.     Cervical back: Normal range of motion and neck supple.     Right lower leg: No edema.     Left lower leg: No edema.  Skin:    General: Skin is warm and dry.  Neurological:     Mental Status: He is alert and oriented to person, place, and time.     Deep Tendon Reflexes: Reflexes are normal and symmetric.  Psychiatric:        Mood and Affect: Mood  normal.        Behavior: Behavior normal.        Thought Content: Thought content normal.    ASSESSMENT and PLAN: This visit occurred during the SARS-CoV-2 public health emergency.  Safety protocols were in place, including screening questions prior to the visit, additional usage of staff PPE, and extensive cleaning of exam room while observing appropriate contact time as indicated for disinfecting solutions.   Perris was seen today  for annual exam.  Diagnoses and all orders for this visit:  Encounter for preventative adult health care exam with abnormal findings -     Comprehensive metabolic panel -     CBC with Differential/Platelet -     TSH -     PSA  Pure hypercholesterolemia -     Lipid panel  Prostate cancer screening -     PSA      Problem List Items Addressed This Visit       Other   Pure hypercholesterolemia    Reports daytime somnolence with fenofibrate. Reports he has made dietary modifications (low fat and low carb diet) No change in activity level. Smoke 1cigar per month ETOH consumption once a month (1-2beers)  repeat lipid panel today Maintain heart healthy diet and add daily exercise      Relevant Orders   Lipid panel   Other Visit Diagnoses     Encounter for preventative adult health care exam with abnormal findings    -  Primary   Relevant Orders   Comprehensive metabolic panel   CBC with Differential/Platelet   TSH   PSA   Prostate cancer screening       Relevant Orders   PSA       Follow up: Return in about 1 year (around 05/22/2022) for CPE (fasting).  Alysia Penna, NP

## 2021-05-22 NOTE — Patient Instructions (Addendum)
Go to lab for blood draw.  Diabetes Mellitus and Nutrition, Adult When you have diabetes, or diabetes mellitus, it is very important to have healthy eating habits because your blood sugar (glucose) levels are greatly affected by what you eat and drink. Eating healthy foods in the right amounts, at about the same times every day, can help you: Manage your blood glucose. Lower your risk of heart disease. Improve your blood pressure. Reach or maintain a healthy weight. What can affect my meal plan? Every person with diabetes is different, and each person has different needs for a meal plan. Your health care provider may recommend that you work with a dietitian to make a meal plan that is best for you. Your meal plan may vary depending on factors such as: The calories you need. The medicines you take. Your weight. Your blood glucose, blood pressure, and cholesterol levels. Your activity level. Other health conditions you have, such as heart or kidney disease. How do carbohydrates affect me? Carbohydrates, also called carbs, affect your blood glucose level more than any other type of food. Eating carbs raises the amount of glucose in your blood. It is important to know how many carbs you can safely have in each meal. This is different for every person. Your dietitian can help you calculate how many carbs you should have at each meal and for each snack. How does alcohol affect me? Alcohol can cause a decrease in blood glucose (hypoglycemia), especially if you use insulin or take certain diabetes medicines by mouth. Hypoglycemia can be a life-threatening condition. Symptoms of hypoglycemia, such as sleepiness, dizziness, and confusion, are similar to symptoms of having too much alcohol. Do not drink alcohol if: Your health care provider tells you not to drink. You are pregnant, may be pregnant, or are planning to become pregnant. If you drink alcohol: Limit how much you have to: 0-1 drink a day  for women. 0-2 drinks a day for men. Know how much alcohol is in your drink. In the [place] one drink equals one 12 oz bottle of beer (355 mL), one 5 oz glass of wine (148 mL), or one 1 oz glass of hard liquor (44 mL). Keep yourself hydrated with water, diet soda, or unsweetened iced tea. Keep in mind that regular soda, juice, and other mixers may contain a lot of sugar and must be counted as carbs. What are tips for following this plan? Reading food labels Start by checking the serving size on the Nutrition Facts label of packaged foods and drinks. The number of calories and the amount of carbs, fats, and other nutrients listed on the label are based on one serving of the item. Many items contain more than one serving per package. Check the total grams (g) of carbs in one serving. Check the number of grams of saturated fats and trans fats in one serving. Choose foods that have a low amount or none of these fats. Check the number of milligrams (mg) of salt (sodium) in one serving. Most people should limit total sodium intake to less than 2,300 mg per day. Always check the nutrition information of foods labeled as "low-fat" or "nonfat." These foods may be higher in added sugar or refined carbs and should be avoided. Talk to your dietitian to identify your daily goals for nutrients listed on the label. Shopping Avoid buying canned, pre-made, or processed foods. These foods tend to be high in fat, sodium, and added sugar. Shop around the outside edge of the  grocery store. This is where you will most often find fresh fruits and vegetables, bulk grains, fresh meats, and fresh dairy products. Cooking Use low-heat cooking methods, such as baking, instead of high-heat cooking methods, such as deep frying. Cook using healthy oils, such as olive, canola, or sunflower oil. Avoid cooking with butter, cream, or high-fat meats. Meal planning Eat meals and snacks regularly, preferably at the same times every  day. Avoid going long periods of time without eating. Eat foods that are high in fiber, such as fresh fruits, vegetables, beans, and whole grains. Eat 4-6 oz (112-168 g) of lean protein each day, such as lean meat, chicken, fish, eggs, or tofu. One ounce (oz) (28 g) of lean protein is equal to: 1 oz (28 g) of meat, chicken, or fish. 1 egg.  cup (62 g) of tofu. Eat some foods each day that contain healthy fats, such as avocado, nuts, seeds, and fish. What foods should I eat? Fruits Berries. Apples. Oranges. Peaches. Apricots. Plums. Grapes. Mangoes. Papayas. Pomegranates. Kiwi. Cherries. Vegetables Leafy greens, including lettuce, spinach, kale, chard, collard greens, mustard greens, and cabbage. Beets. Cauliflower. Broccoli. Carrots. Green beans. Tomatoes. Peppers. Onions. Cucumbers. Brussels sprouts. Grains Whole grains, such as whole-wheat or whole-grain bread, crackers, tortillas, cereal, and pasta. Unsweetened oatmeal. Quinoa. Brown or wild rice. Meats and other proteins Seafood. Poultry without skin. Lean cuts of poultry and beef. Tofu. Nuts. Seeds. Dairy Low-fat or fat-free dairy products such as milk, yogurt, and cheese. The items listed above may not be a complete list of foods and beverages you can eat and drink. Contact a dietitian for more information. What foods should I avoid? Fruits Fruits canned with syrup. Vegetables Canned vegetables. Frozen vegetables with butter or cream sauce. Grains Refined white flour and flour products such as bread, pasta, snack foods, and cereals. Avoid all processed foods. Meats and other proteins Fatty cuts of meat. Poultry with skin. Breaded or fried meats. Processed meat. Avoid saturated fats. Dairy Full-fat yogurt, cheese, or milk. Beverages Sweetened drinks, such as soda or iced tea. The items listed above may not be a complete list of foods and beverages you should avoid. Contact a dietitian for more information. Questions to ask a  health care provider Do I need to meet with a certified diabetes care and education specialist? Do I need to meet with a dietitian? What number can I call if I have questions? When are the best times to check my blood glucose? Where to find more information: American Diabetes Association: diabetes.org Academy of Nutrition and Dietetics: eatright.[employer] of Diabetes and Digestive and Kidney Diseases: StageSync.si Association of Diabetes Care & Education Specialists: diabeteseducator.org Summary It is important to have healthy eating habits because your blood sugar (glucose) levels are greatly affected by what you eat and drink. It is important to use alcohol carefully. A healthy meal plan will help you manage your blood glucose and lower your risk of heart disease. Your health care provider may recommend that you work with a dietitian to make a meal plan that is best for you. This information is not intended to replace advice given to you by your health care provider. Make sure you discuss any questions you have with your health care provider. Document Revised: 01/04/2020 Document Reviewed: 01/04/2020 Elsevier Patient Education  2022 Elsevier Inc.  Preventing High Cholesterol Cholesterol is a white, waxy substance similar to fat that the human body needs to help build cells. The liver makes all the cholesterol that a  person's body needs. Having high cholesterol (hypercholesterolemia) increases your risk for heart disease and stroke. Extra or excess cholesterol comes from the food that you eat. High cholesterol can often be prevented with diet and lifestyle changes. If you already have high cholesterol, you can control it with diet, lifestyle changes, and medicines. How can high cholesterol affect me? If you have high cholesterol, fatty deposits (plaques) may build up on the walls of your blood vessels. The blood vessels that carry blood away from your heart are called arteries.  Plaques make the arteries narrower and stiffer. This in turn can: Restrict or block blood flow and cause blood clots to form. Increase your risk for heart attack and stroke. What can increase my risk for high cholesterol? This condition is more likely to develop in people who: Eat foods that are high in saturated fat or cholesterol. Saturated fat is mostly found in foods that come from animal sources. Are overweight. Are not getting enough exercise. Use products that contain nicotine or tobacco, such as cigarettes, e-cigarettes, and chewing tobacco. Have a family history of high cholesterol (familial hypercholesterolemia). What actions can I take to prevent this? Nutrition  Eat less saturated fat. Avoid trans fats (partially hydrogenated oils). These are often found in margarine and in some baked goods, fried foods, and snacks bought in packages. Avoid precooked or cured meat, such as bacon, sausages, or meat loaves. Avoid foods and drinks that have added sugars. Eat more fruits, vegetables, and whole grains. Choose healthy sources of protein, such as fish, poultry, lean cuts of red meat, beans, peas, lentils, and nuts. Choose healthy sources of fat, such as: Nuts. Vegetable oils, especially olive oil. Fish that have healthy fats, such as omega-3 fatty acids. These fish include mackerel or salmon. Lifestyle Lose weight if you are overweight. Maintaining a healthy body mass index (BMI) can help prevent or control high cholesterol. It can also lower your risk for diabetes and high blood pressure. Ask your health care provider to help you with a diet and exercise plan to lose weight safely. Do not use any products that contain nicotine or tobacco. These products include cigarettes, chewing tobacco, and vaping devices, such as e-cigarettes. If you need help quitting, ask your health care provider. Alcohol use Do not drink alcohol if: Your health care provider tells you not to drink. You are  pregnant, may be pregnant, or are planning to become pregnant. If you drink alcohol: Limit how much you have to: 0-1 drink a day for women. 0-2 drinks a day for men. Know how much alcohol is in your drink. In the [place] one drink equals one 12 oz bottle of beer (355 mL), one 5 oz glass of wine (148 mL), or one 1 oz glass of hard liquor (44 mL). Activity  Get enough exercise. Do exercises as told by your health care provider. Each week, do at least 150 minutes of exercise that takes a medium level of effort (moderate-intensity exercise). This kind of exercise: Makes your heart beat faster while allowing you to still be able to talk. Can be done in short sessions several times a day or longer sessions a few times a week. For example, on 5 days each week, you could walk fast or ride your bike 3 times a day for 10 minutes each time. Medicines Your health care provider may recommend medicines to help lower cholesterol. This may be a medicine to lower the amount of cholesterol that your liver makes. You may need  medicine if: Diet and lifestyle changes have not lowered your cholesterol enough. You have high cholesterol and other risk factors for heart disease or stroke. Take over-the-counter and prescription medicines only as told by your health care provider. General information Manage your risk factors for high cholesterol. Talk with your health care provider about all your risk factors and how to lower your risk. Manage other conditions that you have, such as diabetes or high blood pressure (hypertension). Have blood tests to check your cholesterol levels at regular points in time as told by your health care provider. Keep all follow-up visits. This is important. Where to find more information American Heart Association: www.heart.org National Heart, Lung, and Blood Institute: PopSteam.is Summary High cholesterol increases your risk for heart disease and stroke. By keeping your  cholesterol level low, you can reduce your risk for these conditions. High cholesterol can often be prevented with diet and lifestyle changes. Work with your health care provider to manage your risk factors, and have your blood tested regularly. This information is not intended to replace advice given to you by your health care provider. Make sure you discuss any questions you have with your health care provider. Document Revised: 08/06/2020 Document Reviewed: 08/06/2020 Elsevier Patient Education  2022 [employer].

## 2021-05-22 NOTE — Assessment & Plan Note (Signed)
Reports daytime somnolence with fenofibrate. Reports he has made dietary modifications (low fat and low carb diet) No change in activity level. Smoke 1cigar per month ETOH consumption once a month (1-2beers)  repeat lipid panel today Maintain heart healthy diet and add daily exercise

## 2021-05-23 LAB — CBC WITH DIFFERENTIAL/PLATELET
Basophils Absolute: 0 10*3/uL (ref 0.0–0.1)
Basophils Relative: 0.7 % (ref 0.0–3.0)
Eosinophils Absolute: 0 10*3/uL (ref 0.0–0.7)
Eosinophils Relative: 1.3 % (ref 0.0–5.0)
HCT: 45.7 % (ref 39.0–52.0)
Hemoglobin: 15 g/dL (ref 13.0–17.0)
Lymphocytes Relative: 56.9 % — ABNORMAL HIGH (ref 12.0–46.0)
Lymphs Abs: 2.1 10*3/uL (ref 0.7–4.0)
MCHC: 32.8 g/dL (ref 30.0–36.0)
MCV: 82.9 fl (ref 78.0–100.0)
Monocytes Absolute: 0.3 10*3/uL (ref 0.1–1.0)
Monocytes Relative: 6.8 % (ref 3.0–12.0)
Neutro Abs: 1.3 10*3/uL — ABNORMAL LOW (ref 1.4–7.7)
Neutrophils Relative %: 34.3 % — ABNORMAL LOW (ref 43.0–77.0)
Platelets: 237 10*3/uL (ref 150.0–400.0)
RBC: 5.51 Mil/uL (ref 4.22–5.81)
RDW: 13.9 % (ref 11.5–15.5)
WBC: 3.8 10*3/uL — ABNORMAL LOW (ref 4.0–10.5)

## 2021-05-23 LAB — COMPREHENSIVE METABOLIC PANEL
ALT: 59 U/L — ABNORMAL HIGH (ref 0–53)
AST: 36 U/L (ref 0–37)
Albumin: 4.6 g/dL (ref 3.5–5.2)
Alkaline Phosphatase: 56 U/L (ref 39–117)
BUN: 18 mg/dL (ref 6–23)
CO2: 30 mEq/L (ref 19–32)
Calcium: 10.1 mg/dL (ref 8.4–10.5)
Chloride: 103 mEq/L (ref 96–112)
Creatinine, Ser: 1.34 mg/dL (ref 0.40–1.50)
GFR: 60.16 mL/min (ref >60.00)
Glucose, Bld: 95 mg/dL (ref 70–99)
Potassium: 4.2 mEq/L (ref 3.5–5.1)
Sodium: 141 mEq/L (ref 135–145)
Total Bilirubin: 1 mg/dL (ref 0.2–1.2)
Total Protein: 7.1 g/dL (ref 6.0–8.3)

## 2021-05-23 LAB — LIPID PANEL
Cholesterol: 275 mg/dL — ABNORMAL HIGH (ref 0–200)
HDL: 39.8 mg/dL (ref >39.00)
LDL Cholesterol: 197 mg/dL — ABNORMAL HIGH (ref 0–99)
NonHDL: 235.01
Total CHOL/HDL Ratio: 7
Triglycerides: 188 mg/dL — ABNORMAL HIGH (ref 0.0–149.0)
VLDL: 37.6 mg/dL (ref 0.0–40.0)

## 2021-05-23 LAB — TSH: TSH: 1.3 u[IU]/mL (ref 0.35–5.50)

## 2021-05-23 LAB — PSA: PSA: 0.91 ng/mL (ref 0.10–4.00)

## 2021-05-26 NOTE — Addendum Note (Signed)
Addended by: Michaela Corner on: 05/26/2021 06:40 PM   Modules accepted: Orders

## 2021-06-24 ENCOUNTER — Telehealth: Payer: Self-pay | Admitting: Nurse Practitioner

## 2021-06-24 ENCOUNTER — Other Ambulatory Visit: Payer: Medicaid Other

## 2021-06-24 NOTE — Telephone Encounter (Signed)
Pt no showed for labs 06/24/21

## 2023-06-15 ENCOUNTER — Encounter: Payer: Medicaid Other | Admitting: Nurse Practitioner

## 2023-07-16 ENCOUNTER — Encounter: Payer: Medicaid Other | Admitting: Nurse Practitioner

## 2023-08-06 ENCOUNTER — Ambulatory Visit (INDEPENDENT_AMBULATORY_CARE_PROVIDER_SITE_OTHER): Payer: Medicaid Other | Admitting: Nurse Practitioner

## 2023-08-06 ENCOUNTER — Encounter: Payer: Self-pay | Admitting: Nurse Practitioner

## 2023-08-06 VITALS — BP 130/88 | HR 76 | Temp 98.6°F | Ht 72.0 in | Wt 241.0 lb

## 2023-08-06 DIAGNOSIS — Z0001 Encounter for general adult medical examination with abnormal findings: Secondary | ICD-10-CM

## 2023-08-06 DIAGNOSIS — R351 Nocturia: Secondary | ICD-10-CM

## 2023-08-06 DIAGNOSIS — E7800 Pure hypercholesterolemia, unspecified: Secondary | ICD-10-CM | POA: Diagnosis not present

## 2023-08-06 NOTE — Assessment & Plan Note (Signed)
Reports he has made dietary modifications (low fat and low carb diet), daily exercise, stopped tobacco use, continues ETOH consumption once a month (1drink of wine or liquor)  repeat lipid panel today Advised to maintain a mediterranean diet and continue daily exercise

## 2023-08-06 NOTE — Progress Notes (Signed)
Complete physical exam  Patient: Christian Lozano   DOB: [birth year]   58 y.o. Male  MRN: [id number] Visit Date: 08/06/2023  Subjective:    Chief Complaint  Patient presents with   Annual Exam    No concerns-patient is fasting   Christian Lozano is a 58 y.o. male who presents today for a complete physical exam. He reports consuming a general diet.  Exercise daily weight training and cardio  He generally feels well. He reports sleeping well. He does not have additional problems to discuss today.  Vision:Yes Dental:No STD Screen:No PSA:Yes BP Readings from Last 3 Encounters:  08/06/23 130/88  05/22/21 116/76  05/03/20 120/82   Wt Readings from Last 3 Encounters:  08/06/23 241 lb (109.3 kg)  05/22/21 238 lb (108 kg)  05/03/20 233 lb 3.2 oz (105.8 kg)   Most recent fall risk assessment:    08/06/2023   10:48 AM  Fall Risk   Falls in the past year? 0  Number falls in past yr: 0  Injury with Fall? 0  Risk for fall due to : No Fall Risks  Follow up Falls evaluation completed   Depression screen:Yes - No Depression  Most recent depression screenings:    08/06/2023   10:48 AM 05/22/2021    1:29 PM  PHQ 2/9 Scores  PHQ - 2 Score 0 0  PHQ- 9 Score 0    HPI  Pure hypercholesterolemia Reports he has made dietary modifications (low fat and low carb diet), daily exercise, stopped tobacco use, continues ETOH consumption once a month (1drink of wine or liquor)  repeat lipid panel today Advised to maintain a mediterranean diet and continue daily exercise  Past Medical History:  Diagnosis Date   Hyperlipidemia    Past Surgical History:  Procedure Laterality Date   BACK SURGERY     BACK SURGERY     HAND SURGERY     Social History   Socioeconomic History   Marital status: Married    Spouse name: Not on file   Number of children: Not on file   Years of education: Not on file   Highest education level: Not on file  Occupational History   Not on file  Tobacco Use   Smoking  status: Former    Types: Cigars   Smokeless tobacco: Never   Tobacco comments:    once in blue moon  Vaping Use   Vaping status: Never Used  Substance and Sexual Activity   Alcohol use: Yes    Alcohol/week: 2.0 standard drinks of alcohol    Types: 1 Glasses of wine, 1 Shots of liquor per week    Comment: socially   Drug use: No   Sexual activity: Yes    Birth control/protection: None  Other Topics Concern   Not on file  Social History Narrative   Not on file   Social Drivers of Health   Financial Resource Strain: Not on file  Food Insecurity: Not on file  Transportation Needs: Not on file  Physical Activity: Not on file  Stress: Not on file  Social Connections: Not on file  Intimate Partner Violence: Not on file   Family Status  Relation Name Status   Mother  Deceased   MGM  Deceased   Father unknown Alive   Mat Uncle  Alive  No partnership data on file   Family History  Problem Relation Age of Onset   HIV/AIDS Mother    Diabetes Maternal Grandmother    Diabetes Maternal  Uncle    No Known Allergies  Patient Care Team: Emalie Mcwethy, Bonna Gains, NP as PCP - General (Internal Medicine)   Medications: Outpatient Medications Prior to Visit  Medication Sig   [DISCONTINUED] fluticasone (FLONASE) 50 MCG/ACT nasal spray Place 2 sprays into both nostrils daily. (Patient not taking: Reported on 08/06/2023)   No facility-administered medications prior to visit.    Review of Systems  Constitutional:  Negative for activity change, appetite change and unexpected weight change.  Respiratory: Negative.    Cardiovascular: Negative.   Gastrointestinal: Negative.   Endocrine: Negative for cold intolerance and heat intolerance.  Genitourinary: Negative.   Musculoskeletal: Negative.   Skin: Negative.   Neurological: Negative.   Hematological: Negative.   Psychiatric/Behavioral:  Negative for behavioral problems, decreased concentration, dysphoric mood, hallucinations,  self-injury, sleep disturbance and suicidal ideas. The patient is not nervous/anxious.         Objective:  BP 130/88 (BP Location: Right Arm, Patient Position: Sitting, Cuff Size: Normal)   Pulse 76   Temp 98.6 F (37 C)   Ht 6' (1.829 m)   Wt 241 lb (109.3 kg)   SpO2 99%   BMI 32.69 kg/m     Physical Exam Vitals and nursing note reviewed.  Constitutional:      General: He is not in acute distress. HENT:     Right Ear: Tympanic membrane, ear canal and external ear normal.     Left Ear: Tympanic membrane, ear canal and external ear normal.     Nose: Nose normal.  Eyes:     Extraocular Movements: Extraocular movements intact.     Conjunctiva/sclera: Conjunctivae normal.     Pupils: Pupils are equal, round, and reactive to light.  Neck:     Thyroid: No thyroid mass, thyromegaly or thyroid tenderness.  Cardiovascular:     Rate and Rhythm: Normal rate and regular rhythm.     Pulses: Normal pulses.     Heart sounds: Normal heart sounds.  Pulmonary:     Effort: Pulmonary effort is normal.     Breath sounds: Normal breath sounds.  Abdominal:     General: Bowel sounds are normal.     Palpations: Abdomen is soft.  Musculoskeletal:        General: Normal range of motion.     Cervical back: Normal range of motion and neck supple.     Right lower leg: No edema.     Left lower leg: No edema.  Lymphadenopathy:     Cervical: No cervical adenopathy.  Skin:    General: Skin is warm and dry.  Neurological:     Mental Status: He is alert and oriented to person, place, and time.     Cranial Nerves: No cranial nerve deficit.  Psychiatric:        Mood and Affect: Mood normal.        Behavior: Behavior normal.        Thought Content: Thought content normal.     No results found for any visits on 08/06/23.    Assessment & Plan:    Routine Health Maintenance and Physical Exam   There is no immunization history on file for this patient.  Health Maintenance  Topic Date Due    COVID-19 Vaccine (1 - 2024-25 season) 08/22/2023 (Originally 02/15/2023)   INFLUENZA VACCINE  09/14/2023 (Originally 01/15/2023)   Zoster Vaccines- Shingrix (1 of 2) 11/03/2023 (Originally 02/15/2017)   DTaP/Tdap/Td (1 - Tdap) 08/05/2024 (Originally 02/15/1986)   Hepatitis C Screening  08/05/2024 (  Originally 02/15/1985)   Colonoscopy  10/06/2027   HIV Screening  Completed   HPV VACCINES  Aged Out   Discussed health benefits of physical activity, and encouraged him to engage in regular exercise appropriate for his age and condition.  Problem List Items Addressed This Visit     Pure hypercholesterolemia   Reports he has made dietary modifications (low fat and low carb diet), daily exercise, stopped tobacco use, continues ETOH consumption once a month (1drink of wine or liquor)  repeat lipid panel today Advised to maintain a mediterranean diet and continue daily exercise      Relevant Orders   Lipid panel   Other Visit Diagnoses       Encounter for preventative adult health care exam with abnormal findings    -  Primary   Relevant Orders   CBC   Comprehensive metabolic panel     Nocturia       Relevant Orders   PSA      Return in about 1 year (around 08/05/2024) for CPE (fasting).     Alysia Penna, NP

## 2023-08-06 NOTE — Patient Instructions (Addendum)
 Schedule fasting lab appt. Need to be fasting 8hrs prior to blood draw. Ok to drink water and take BP meds. Maintain Heart healthy diet and daily exercise.  Mediterranean Diet A Mediterranean diet is based on the traditions of countries on the [employer]. It focuses on eating more: Fruits and vegetables. Whole grains, beans, nuts, and seeds. Heart-healthy fats. These are fats that are good for your heart. It involves eating less: Dairy. Meat and eggs. Processed foods with added sugar, salt, and fat. This type of diet can help prevent certain conditions. It can also improve outcomes if you have a long-term (chronic) disease, such as kidney or heart disease. What are tips for following this plan? Reading food labels Check packaged foods for: The serving size. For foods such as rice and pasta, the serving size is the amount of cooked product, not dry. The total fat. Avoid foods with saturated fat or trans fat. Added sugars, such as corn syrup. Shopping  Try to have a balanced diet. Buy a variety of foods, such as: Fresh fruits and vegetables. You may be able to get these from local farmers markets. You can also buy them frozen. Grains, beans, nuts, and seeds. Some of these can be bought in bulk. Fresh seafood. Poultry and eggs. Low-fat dairy products. Buy whole ingredients instead of foods that have already been packaged. If you can't get fresh seafood, buy precooked frozen shrimp or canned fish, such as tuna, salmon, or sardines. Stock your pantry so you always have certain foods on hand, such as olive oil, canned tuna, canned tomatoes, rice, pasta, and beans. Cooking Cook foods with extra-virgin olive oil instead of using butter or other vegetable oils. Have meat as a side dish. Have vegetables or grains as your main dish. This means having meat in small portions or adding small amounts of meat to foods like pasta or stew. Use beans or vegetables instead of meat in common  dishes like chili or lasagna. Try out different cooking methods. Try roasting, broiling, steaming, and sauting vegetables. Add frozen vegetables to soups, stews, pasta, or rice. Add nuts or seeds for added healthy fats and plant protein at each meal. You can add these to yogurt, salads, or vegetable dishes. Marinate fish or vegetables using olive oil, lemon juice, garlic, and fresh herbs. Meal planning Plan to eat a vegetarian meal one day each week. Try to work up to two vegetarian meals, if possible. Eat seafood two or more times a week. Have healthy snacks on hand. These may include: Vegetable sticks with hummus. Greek yogurt. Fruit and nut trail mix. Eat balanced meals. These should include: Fruit: 2-3 servings a day. Vegetables: 4-5 servings a day. Low-fat dairy: 2 servings a day. Fish, poultry, or lean meat: 1 serving a day. Beans and legumes: 2 or more servings a week. Nuts and seeds: 1-2 servings a day. Whole grains: 6-8 servings a day. Extra-virgin olive oil: 3-4 servings a day. Limit red meat and sweets to just a few servings a month. Lifestyle  Try to cook and eat meals with your family. Drink enough fluid to keep your pee (urine) pale yellow. Be active every day. This includes: Aerobic exercise, which is exercise that causes your heart to beat faster. Examples include running and swimming. Leisure activities like gardening, walking, or housework. Get 7-8 hours of sleep each night. Drink red wine if your provider says you can. A glass of wine is 5 oz (150 mL). You may be allowed to have: Up  to 1 glass a day if you're male and not pregnant. Up to 2 glasses a day if you're male. What foods should I eat? Fruits Apples. Apricots. Avocado. Berries. Bananas. Cherries. Dates. Figs. Grapes. Lemons. Melon. Oranges. Peaches. Plums. Pomegranate. Vegetables Artichokes. Beets. Broccoli. Cabbage. Carrots. Eggplant. Green beans. Chard. Kale. Spinach. Onions. Leeks. Peas. Squash.  Tomatoes. Peppers. Radishes. Grains Whole-grain pasta. Brown rice. Bulgur wheat. Polenta. Couscous. Whole-wheat bread. Orpah Cobb. Meats and other proteins Beans. Almonds. Sunflower seeds. Pine nuts. Peanuts. Cod. Salmon. Scallops. Shrimp. Tuna. Tilapia. Clams. Oysters. Eggs. Chicken or [place] without skin. Dairy Low-fat milk. Cheese. Greek yogurt. Fats and oils Extra-virgin olive oil. Avocado oil. Grapeseed oil. Beverages Water. Red wine. Herbal tea. Sweets and desserts Greek yogurt with honey. Baked apples. Poached pears. Trail mix. Seasonings and condiments Basil. Cilantro. Coriander. Cumin. Mint. Parsley. Sage. Rosemary. Tarragon. Garlic. Oregano. Thyme. Pepper. Balsamic vinegar. Tahini. Hummus. Tomato sauce. Olives. Mushrooms. The items listed above may not be all the foods and drinks you can have. Talk to a dietitian to learn more. What foods should I limit? This is a list of foods that should be eaten rarely. Fruits Fruit canned in syrup. Vegetables Deep-fried potatoes, like [place] fries. Grains Packaged pasta or rice dishes. Cereal with added sugar. Snacks with added sugar. Meats and other proteins Beef. Pork. Lamb. Chicken or [place] with skin. Hot dogs. Tomasa Blase. Dairy Ice cream. Sour cream. Whole milk. Fats and oils Butter. Canola oil. Vegetable oil. Beef fat (tallow). Lard. Beverages Juice. Sugar-sweetened soft drinks. Beer. Liquor and spirits. Sweets and desserts Cookies. Cakes. Pies. Candy. Seasonings and condiments Mayonnaise. Pre-made sauces and marinades. The items listed above may not be all the foods and drinks you should limit. Talk to a dietitian to learn more. Where to find more information American Heart Association (AHA): heart.org This information is not intended to replace advice given to you by your health care provider. Make sure you discuss any questions you have with your health care provider. Document Revised: 09/14/2022 Document Reviewed:  09/14/2022 Elsevier Patient Education  2024 [employer].

## 2023-08-10 ENCOUNTER — Other Ambulatory Visit (INDEPENDENT_AMBULATORY_CARE_PROVIDER_SITE_OTHER): Payer: Medicaid Other

## 2023-08-10 DIAGNOSIS — R351 Nocturia: Secondary | ICD-10-CM

## 2023-08-10 DIAGNOSIS — E782 Mixed hyperlipidemia: Secondary | ICD-10-CM

## 2023-08-10 DIAGNOSIS — Z0001 Encounter for general adult medical examination with abnormal findings: Secondary | ICD-10-CM

## 2023-08-10 DIAGNOSIS — E7800 Pure hypercholesterolemia, unspecified: Secondary | ICD-10-CM

## 2023-08-10 LAB — COMPREHENSIVE METABOLIC PANEL
ALT: 48 U/L (ref 0–53)
AST: 36 U/L (ref 0–37)
Albumin: 4.8 g/dL (ref 3.5–5.2)
Alkaline Phosphatase: 54 U/L (ref 39–117)
BUN: 15 mg/dL (ref 6–23)
CO2: 26 meq/L (ref 19–32)
Calcium: 9.7 mg/dL (ref 8.4–10.5)
Chloride: 105 meq/L (ref 96–112)
Creatinine, Ser: 1.16 mg/dL (ref 0.40–1.50)
GFR: 70.42 mL/min (ref >60.00)
Glucose, Bld: 104 mg/dL — ABNORMAL HIGH (ref 70–99)
Potassium: 4.6 meq/L (ref 3.5–5.1)
Sodium: 141 meq/L (ref 135–145)
Total Bilirubin: 0.9 mg/dL (ref 0.2–1.2)
Total Protein: 7.4 g/dL (ref 6.0–8.3)

## 2023-08-10 LAB — CBC
HCT: 45.7 % (ref 39.0–52.0)
Hemoglobin: 15.2 g/dL (ref 13.0–17.0)
MCHC: 33.3 g/dL (ref 30.0–36.0)
MCV: 83 fL (ref 78.0–100.0)
Platelets: 274 10*3/uL (ref 150.0–400.0)
RBC: 5.51 Mil/uL (ref 4.22–5.81)
RDW: 13.7 % (ref 11.5–15.5)
WBC: 5.5 10*3/uL (ref 4.0–10.5)

## 2023-08-10 LAB — LIPID PANEL
Cholesterol: 280 mg/dL — ABNORMAL HIGH (ref 0–200)
HDL: 41.5 mg/dL (ref >39.00)
LDL Cholesterol: 193 mg/dL — ABNORMAL HIGH (ref 0–99)
NonHDL: 238.41
Total CHOL/HDL Ratio: 7
Triglycerides: 226 mg/dL — ABNORMAL HIGH (ref 0.0–149.0)
VLDL: 45.2 mg/dL — ABNORMAL HIGH (ref 0.0–40.0)

## 2023-08-10 LAB — PSA: PSA: 1.2 ng/mL (ref 0.10–4.00)

## 2023-08-12 ENCOUNTER — Other Ambulatory Visit (HOSPITAL_BASED_OUTPATIENT_CLINIC_OR_DEPARTMENT_OTHER): Payer: Self-pay

## 2023-08-12 ENCOUNTER — Encounter: Payer: Self-pay | Admitting: Nurse Practitioner

## 2023-08-12 MED ORDER — FENOFIBRATE 145 MG PO TABS
145.0000 mg | ORAL_TABLET | Freq: Every day | ORAL | 3 refills | Status: AC
  Filled 2023-08-12 – 2023-08-26 (×2): qty 90, 90d supply, fill #0

## 2023-08-12 NOTE — Addendum Note (Signed)
 Addended by: Michaela Corner on: 08/12/2023 02:41 PM   Modules accepted: Orders

## 2023-08-12 NOTE — Progress Notes (Signed)
 Persistent Abnormal lipid panel: start fenofibrate. New PRESCRIPTION sent. Minimize ALCOHOL consumption and maintain a mediterranean diet. Schedule alb appointment to repeat lipid panel in 3months (fasting)

## 2023-08-24 ENCOUNTER — Other Ambulatory Visit (HOSPITAL_BASED_OUTPATIENT_CLINIC_OR_DEPARTMENT_OTHER): Payer: Self-pay

## 2023-08-26 ENCOUNTER — Other Ambulatory Visit (HOSPITAL_BASED_OUTPATIENT_CLINIC_OR_DEPARTMENT_OTHER): Payer: Self-pay
